# Patient Record
Sex: Male | Born: 1970 | Race: White | Hispanic: No | Marital: Married | Smoking: Former smoker
Health system: Southern US, Community
[De-identification: ages and names within clinical notes are randomized; demographics above are authoritative.]

## PROBLEM LIST (undated history)

## (undated) DIAGNOSIS — T6701XA Heatstroke and sunstroke, initial encounter: Secondary | ICD-10-CM

## (undated) DIAGNOSIS — K219 Gastro-esophageal reflux disease without esophagitis: Secondary | ICD-10-CM

## (undated) DIAGNOSIS — M1990 Unspecified osteoarthritis, unspecified site: Secondary | ICD-10-CM

## (undated) DIAGNOSIS — K648 Other hemorrhoids: Secondary | ICD-10-CM

## (undated) DIAGNOSIS — T7840XA Allergy, unspecified, initial encounter: Secondary | ICD-10-CM

## (undated) DIAGNOSIS — R9431 Abnormal electrocardiogram [ECG] [EKG]: Secondary | ICD-10-CM

## (undated) DIAGNOSIS — G4730 Sleep apnea, unspecified: Secondary | ICD-10-CM

## (undated) DIAGNOSIS — I10 Essential (primary) hypertension: Secondary | ICD-10-CM

## (undated) HISTORY — DX: Allergy, unspecified, initial encounter: T78.40XA

## (undated) HISTORY — PX: FRACTURE SURGERY: SHX138

## (undated) HISTORY — DX: Other hemorrhoids: K64.8

## (undated) HISTORY — PX: KNEE SURGERY: SHX244

## (undated) HISTORY — PX: HEMORRHOID BANDING: SHX5850

## (undated) HISTORY — PX: NECK SURGERY: SHX720

---

## 1998-02-09 ENCOUNTER — Ambulatory Visit (HOSPITAL_COMMUNITY): Admission: RE | Admit: 1998-02-09 | Discharge: 1998-02-09 | Payer: Self-pay | Admitting: *Deleted

## 1999-02-07 ENCOUNTER — Ambulatory Visit (HOSPITAL_COMMUNITY): Admission: RE | Admit: 1999-02-07 | Discharge: 1999-02-07 | Payer: Self-pay | Admitting: *Deleted

## 1999-03-18 ENCOUNTER — Ambulatory Visit (HOSPITAL_COMMUNITY): Admission: RE | Admit: 1999-03-18 | Discharge: 1999-03-18 | Payer: Self-pay | Admitting: *Deleted

## 1999-03-18 ENCOUNTER — Encounter: Payer: Self-pay | Admitting: *Deleted

## 1999-03-26 ENCOUNTER — Observation Stay (HOSPITAL_COMMUNITY): Admission: RE | Admit: 1999-03-26 | Discharge: 1999-03-27 | Payer: Self-pay | Admitting: Neurosurgery

## 1999-07-10 ENCOUNTER — Encounter: Admission: RE | Admit: 1999-07-10 | Discharge: 1999-07-10 | Payer: Self-pay | Admitting: Neurosurgery

## 1999-09-11 ENCOUNTER — Encounter: Admission: RE | Admit: 1999-09-11 | Discharge: 1999-09-11 | Payer: Self-pay | Admitting: Neurosurgery

## 2002-05-13 ENCOUNTER — Emergency Department (HOSPITAL_COMMUNITY): Admission: EM | Admit: 2002-05-13 | Discharge: 2002-05-13 | Payer: Self-pay | Admitting: Emergency Medicine

## 2002-05-13 ENCOUNTER — Encounter: Payer: Self-pay | Admitting: Emergency Medicine

## 2006-04-04 ENCOUNTER — Encounter: Admission: RE | Admit: 2006-04-04 | Discharge: 2006-04-04 | Payer: Self-pay | Admitting: Family Medicine

## 2011-08-17 ENCOUNTER — Other Ambulatory Visit: Payer: Self-pay | Admitting: Neurosurgery

## 2011-08-17 DIAGNOSIS — M25511 Pain in right shoulder: Secondary | ICD-10-CM

## 2011-08-17 DIAGNOSIS — M542 Cervicalgia: Secondary | ICD-10-CM

## 2011-08-23 ENCOUNTER — Ambulatory Visit
Admission: RE | Admit: 2011-08-23 | Discharge: 2011-08-23 | Disposition: A | Discharge status: Home / Self Care | Payer: PRIVATE HEALTH INSURANCE | Source: Ambulatory Visit | Attending: Neurosurgery | Admitting: Neurosurgery

## 2011-08-23 DIAGNOSIS — M542 Cervicalgia: Secondary | ICD-10-CM

## 2011-08-23 DIAGNOSIS — M25511 Pain in right shoulder: Secondary | ICD-10-CM

## 2011-09-15 NOTE — Progress Notes (Signed)
Called and requested orders from Dr. Trudee Grip office, spoke with Lupita Leash

## 2011-09-16 ENCOUNTER — Encounter (HOSPITAL_COMMUNITY)
Admission: RE | Admit: 2011-09-16 | Discharge: 2011-09-16 | Disposition: A | Discharge status: Home / Self Care | Payer: PRIVATE HEALTH INSURANCE | Source: Ambulatory Visit | Attending: Neurosurgery | Admitting: Neurosurgery

## 2011-09-16 ENCOUNTER — Encounter (HOSPITAL_COMMUNITY): Payer: Self-pay | Admitting: Pharmacy Technician

## 2011-09-16 ENCOUNTER — Encounter (HOSPITAL_COMMUNITY): Payer: Self-pay

## 2011-09-16 HISTORY — DX: Abnormal electrocardiogram (ECG) (EKG): R94.31

## 2011-09-16 HISTORY — DX: Gastro-esophageal reflux disease without esophagitis: K21.9

## 2011-09-16 HISTORY — DX: Unspecified osteoarthritis, unspecified site: M19.90

## 2011-09-16 LAB — CBC
Hemoglobin: 15.5 g/dL (ref 13.0–17.0)
MCV: 90.2 fL (ref 78.0–100.0)
Platelets: 216 10*3/uL (ref 150–400)
RBC: 4.9 MIL/uL (ref 4.22–5.81)
WBC: 6.9 10*3/uL (ref 4.0–10.5)

## 2011-09-16 LAB — SURGICAL PCR SCREEN: MRSA, PCR: NEGATIVE

## 2011-09-16 MED ORDER — VANCOMYCIN HCL 500 MG IV SOLR
500.0000 mg | Freq: Once | INTRAVENOUS | Status: AC
  Administered 2011-09-17: 500 mg via INTRAVENOUS
  Filled 2011-09-16: qty 500

## 2011-09-16 MED ORDER — TOBRAMYCIN SULFATE 80 MG/2ML IJ SOLN
80.0000 mg | Freq: Once | INTRAVENOUS | Status: AC
  Administered 2011-09-17: 80 mg via INTRAVENOUS
  Filled 2011-09-16: qty 2

## 2011-09-16 NOTE — Pre-Procedure Instructions (Signed)
20 MANUS WEEDMAN  09/16/2011   Your procedure is scheduled on:  Friday September 17 2011  Report to [facility] at 0530 AM.  Call this number if you have problems the morning of surgery: [phone]   Remember:   Do not eat food:After Midnight.  May have clear liquids: up to 4 Hours before arrival.(up to 1:30am)  Clear liquids include soda, tea, black coffee, apple or grape juice, broth.  Take these medicines the morning of surgery with A SIP OF WATER: hydrocodone, tramadol  Do not wear jewelry, make-up or nail polish.  Do not wear lotions, powders, or perfumes. You may wear deodorant.  Do not shave 48 hours prior to surgery.  Do not bring valuables to the hospital.  Contacts, dentures or bridgework may not be worn into surgery.  Leave suitcase in the car. After surgery it may be brought to your room.  For patients admitted to the hospital, checkout time is 11:00 AM the day of discharge.   Patients discharged the day of surgery will not be allowed to drive home.  Name and phone number of your driver: Skyy Mcknight [phone]  Special Instructions: CHG Shower Use Special Wash: 1/2 bottle night before surgery and 1/2 bottle morning of surgery.   Please read over the following fact sheets that you were given: Pain Booklet, Coughing and Deep Breathing, MRSA Information and Surgical Site Infection Prevention

## 2011-09-17 ENCOUNTER — Ambulatory Visit (HOSPITAL_COMMUNITY): Payer: PRIVATE HEALTH INSURANCE

## 2011-09-17 ENCOUNTER — Encounter (HOSPITAL_COMMUNITY): Payer: Self-pay | Admitting: Anesthesiology

## 2011-09-17 ENCOUNTER — Ambulatory Visit (HOSPITAL_COMMUNITY)
Admission: RE | Admit: 2011-09-17 | Discharge: 2011-09-17 | Disposition: A | Discharge status: Home / Self Care | Payer: PRIVATE HEALTH INSURANCE | Source: Ambulatory Visit | Attending: Neurosurgery | Admitting: Neurosurgery

## 2011-09-17 ENCOUNTER — Encounter (HOSPITAL_COMMUNITY)
Admission: RE | Disposition: A | Discharge status: Home / Self Care | Payer: Self-pay | Source: Ambulatory Visit | Attending: Neurosurgery

## 2011-09-17 ENCOUNTER — Encounter (HOSPITAL_COMMUNITY): Payer: Self-pay | Admitting: *Deleted

## 2011-09-17 ENCOUNTER — Ambulatory Visit (HOSPITAL_COMMUNITY): Payer: PRIVATE HEALTH INSURANCE | Admitting: Anesthesiology

## 2011-09-17 DIAGNOSIS — M5020 Other cervical disc displacement, unspecified cervical region: Secondary | ICD-10-CM | POA: Insufficient documentation

## 2011-09-17 DIAGNOSIS — K219 Gastro-esophageal reflux disease without esophagitis: Secondary | ICD-10-CM | POA: Insufficient documentation

## 2011-09-17 DIAGNOSIS — Z01812 Encounter for preprocedural laboratory examination: Secondary | ICD-10-CM | POA: Insufficient documentation

## 2011-09-17 HISTORY — PX: ANTERIOR CERVICAL DECOMP/DISCECTOMY FUSION: SHX1161

## 2011-09-17 SURGERY — ANTERIOR CERVICAL DECOMPRESSION/DISCECTOMY FUSION 1 LEVEL
Anesthesia: General | Site: Spine Cervical | Wound class: Clean

## 2011-09-17 MED ORDER — THROMBIN 5000 UNITS EX KIT
PACK | CUTANEOUS | Status: DC | PRN
  Administered 2011-09-17: 5000 [IU] via TOPICAL

## 2011-09-17 MED ORDER — HYDROMORPHONE HCL PF 1 MG/ML IJ SOLN
0.2500 mg | INTRAMUSCULAR | Status: DC | PRN
  Administered 2011-09-17 (×3): 0.5 mg via INTRAVENOUS

## 2011-09-17 MED ORDER — NEOSTIGMINE METHYLSULFATE 1 MG/ML IJ SOLN
INTRAMUSCULAR | Status: DC | PRN
  Administered 2011-09-17: 3 mg via INTRAVENOUS

## 2011-09-17 MED ORDER — SODIUM CHLORIDE 0.9 % IV SOLN
INTRAVENOUS | Status: AC
  Filled 2011-09-17: qty 500

## 2011-09-17 MED ORDER — ONDANSETRON HCL 4 MG/2ML IJ SOLN: 4.0000 mg | INTRAMUSCULAR | Status: DC | PRN

## 2011-09-17 MED ORDER — DIPHENHYDRAMINE HCL 50 MG/ML IJ SOLN
INTRAMUSCULAR | Status: AC
  Filled 2011-09-17: qty 1

## 2011-09-17 MED ORDER — HYDROCODONE-ACETAMINOPHEN 5-325 MG PO TABS
1.0000 | ORAL_TABLET | ORAL | Status: DC | PRN
  Administered 2011-09-17 (×2): 2 via ORAL
  Filled 2011-09-17: qty 2

## 2011-09-17 MED ORDER — HYDROCODONE-ACETAMINOPHEN 5-325 MG PO TABS
ORAL_TABLET | ORAL | Status: AC
  Filled 2011-09-17: qty 2

## 2011-09-17 MED ORDER — DEXAMETHASONE SODIUM PHOSPHATE 10 MG/ML IJ SOLN
INTRAMUSCULAR | Status: AC
  Filled 2011-09-17: qty 1

## 2011-09-17 MED ORDER — FENTANYL CITRATE 0.05 MG/ML IJ SOLN
INTRAMUSCULAR | Status: DC | PRN
  Administered 2011-09-17 (×2): 50 ug via INTRAVENOUS
  Administered 2011-09-17: 100 ug via INTRAVENOUS
  Administered 2011-09-17 (×2): 50 ug via INTRAVENOUS

## 2011-09-17 MED ORDER — ROCURONIUM BROMIDE 100 MG/10ML IV SOLN
INTRAVENOUS | Status: DC | PRN
  Administered 2011-09-17: 5 mg via INTRAVENOUS
  Administered 2011-09-17: 10 mg via INTRAVENOUS
  Administered 2011-09-17: 50 mg via INTRAVENOUS
  Administered 2011-09-17: 10 mg via INTRAVENOUS

## 2011-09-17 MED ORDER — DIAZEPAM 5 MG PO TABS: 5.0000 mg | ORAL_TABLET | Freq: Four times a day (QID) | ORAL | Status: AC | PRN

## 2011-09-17 MED ORDER — DEXAMETHASONE SODIUM PHOSPHATE 10 MG/ML IJ SOLN
10.0000 mg | Freq: Once | INTRAMUSCULAR | Status: AC
  Administered 2011-09-17: 10 mg via INTRAVENOUS

## 2011-09-17 MED ORDER — PROPOFOL 10 MG/ML IV EMUL
INTRAVENOUS | Status: DC | PRN
  Administered 2011-09-17: 200 mg via INTRAVENOUS

## 2011-09-17 MED ORDER — HYDROMORPHONE HCL PF 1 MG/ML IJ SOLN
INTRAMUSCULAR | Status: AC
  Filled 2011-09-17: qty 1

## 2011-09-17 MED ORDER — PHENOL 1.4 % MT LIQD: 1.0000 | OROMUCOSAL | Status: DC | PRN

## 2011-09-17 MED ORDER — LACTATED RINGERS IV SOLN
INTRAVENOUS | Status: DC | PRN
  Administered 2011-09-17 (×2): via INTRAVENOUS

## 2011-09-17 MED ORDER — HEMOSTATIC AGENTS (NO CHARGE) OPTIME
TOPICAL | Status: DC | PRN
  Administered 2011-09-17: 1 via TOPICAL

## 2011-09-17 MED ORDER — HYDROMORPHONE HCL PF 1 MG/ML IJ SOLN
INTRAMUSCULAR | Status: AC
  Administered 2011-09-17: 0.5 mg
  Filled 2011-09-17: qty 1

## 2011-09-17 MED ORDER — BACITRACIN 50000 UNITS IM SOLR
INTRAMUSCULAR | Status: AC
  Filled 2011-09-17: qty 50000

## 2011-09-17 MED ORDER — GLYCOPYRROLATE 0.2 MG/ML IJ SOLN
INTRAMUSCULAR | Status: DC | PRN
  Administered 2011-09-17: .4 mg via INTRAVENOUS

## 2011-09-17 MED ORDER — SODIUM CHLORIDE 0.9 % IJ SOLN: 3.0000 mL | Freq: Two times a day (BID) | INTRAMUSCULAR | Status: DC

## 2011-09-17 MED ORDER — HYDROMORPHONE HCL PF 1 MG/ML IJ SOLN
1.0000 mg | INTRAMUSCULAR | Status: DC | PRN
  Administered 2011-09-17 (×2): 1.5 mg via INTRAMUSCULAR
  Filled 2011-09-17 (×2): qty 2

## 2011-09-17 MED ORDER — DEXAMETHASONE SODIUM PHOSPHATE 4 MG/ML IJ SOLN
4.0000 mg | Freq: Four times a day (QID) | INTRAMUSCULAR | Status: AC
  Administered 2011-09-17: 4 mg via INTRAVENOUS
  Filled 2011-09-17: qty 1

## 2011-09-17 MED ORDER — KCL IN DEXTROSE-NACL 20-5-0.45 MEQ/L-%-% IV SOLN
80.0000 mL/h | INTRAVENOUS | Status: DC
  Administered 2011-09-17: 80 mL/h via INTRAVENOUS
  Filled 2011-09-17 (×2): qty 1000

## 2011-09-17 MED ORDER — DIPHENHYDRAMINE HCL 50 MG/ML IJ SOLN
50.0000 mg | Freq: Once | INTRAMUSCULAR | Status: AC
  Administered 2011-09-17: 50 mg via INTRAVENOUS

## 2011-09-17 MED ORDER — ACETAMINOPHEN 650 MG RE SUPP: 650.0000 mg | RECTAL | Status: DC | PRN

## 2011-09-17 MED ORDER — ACETAMINOPHEN 325 MG PO TABS: 650.0000 mg | ORAL_TABLET | ORAL | Status: DC | PRN

## 2011-09-17 MED ORDER — MIDAZOLAM HCL 5 MG/5ML IJ SOLN
INTRAMUSCULAR | Status: DC | PRN
  Administered 2011-09-17: 2 mg via INTRAVENOUS

## 2011-09-17 MED ORDER — ONDANSETRON HCL 4 MG/2ML IJ SOLN
INTRAMUSCULAR | Status: DC | PRN
  Administered 2011-09-17: 4 mg via INTRAVENOUS

## 2011-09-17 MED ORDER — SODIUM CHLORIDE 0.9 % IJ SOLN: 3.0000 mL | INTRAMUSCULAR | Status: DC | PRN

## 2011-09-17 MED ORDER — DEXAMETHASONE 4 MG PO TABS
4.0000 mg | ORAL_TABLET | Freq: Four times a day (QID) | ORAL | Status: AC
  Administered 2011-09-17: 4 mg via ORAL
  Filled 2011-09-17: qty 1

## 2011-09-17 MED ORDER — PROMETHAZINE HCL 25 MG/ML IJ SOLN: 6.2500 mg | INTRAMUSCULAR | Status: DC | PRN

## 2011-09-17 MED ORDER — OXYCODONE-ACETAMINOPHEN 10-325 MG PO TABS: 1.0000 | ORAL_TABLET | ORAL | Status: AC | PRN

## 2011-09-17 MED ORDER — 0.9 % SODIUM CHLORIDE (POUR BTL) OPTIME
TOPICAL | Status: DC | PRN
  Administered 2011-09-17: 1000 mL

## 2011-09-17 MED ORDER — LACTATED RINGERS IV SOLN: INTRAVENOUS | Status: DC

## 2011-09-17 MED ORDER — MEPERIDINE HCL 25 MG/ML IJ SOLN: 6.2500 mg | INTRAMUSCULAR | Status: DC | PRN

## 2011-09-17 MED ORDER — MENTHOL 3 MG MT LOZG: 1.0000 | LOZENGE | OROMUCOSAL | Status: DC | PRN

## 2011-09-17 MED ORDER — SODIUM CHLORIDE 0.9 % IR SOLN
Status: DC | PRN
  Administered 2011-09-17: 08:00:00

## 2011-09-17 MED ORDER — ZOLPIDEM TARTRATE 5 MG PO TABS: 10.0000 mg | ORAL_TABLET | Freq: Every evening | ORAL | Status: DC | PRN

## 2011-09-17 SURGICAL SUPPLY — 56 items
APL SKNCLS STERI-STRIP NONHPOA (GAUZE/BANDAGES/DRESSINGS) ×1
BAG DECANTER FOR FLEXI CONT (MISCELLANEOUS) ×2 IMPLANT
BENZOIN TINCTURE PRP APPL 2/3 (GAUZE/BANDAGES/DRESSINGS) ×4 IMPLANT
BRUSH SCRUB EZ PLAIN DRY (MISCELLANEOUS) ×2 IMPLANT
CAGE PEEK TAPERED STALIF 8.5MM (Cage) ×1 IMPLANT
CANISTER SUCTION 2500CC (MISCELLANEOUS) ×2 IMPLANT
CLOTH BEACON ORANGE TIMEOUT ST (SAFETY) ×2 IMPLANT
CONT SPEC 4OZ CLIKSEAL STRL BL (MISCELLANEOUS) ×2 IMPLANT
DRAPE C-ARM 42X72 X-RAY (DRAPES) ×4 IMPLANT
DRAPE LAPAROTOMY 100X72 PEDS (DRAPES) ×2 IMPLANT
DRAPE MICROSCOPE ZEISS OPMI (DRAPES) ×2 IMPLANT
DRAPE POUCH INSTRU U-SHP 10X18 (DRAPES) ×2 IMPLANT
DRAPE SURG 17X23 STRL (DRAPES) ×4 IMPLANT
DRESSING TELFA 8X3 (GAUZE/BANDAGES/DRESSINGS) ×2 IMPLANT
ELECT COATED BLADE 2.86 ST (ELECTRODE) ×2 IMPLANT
ELECT REM PT RETURN 9FT ADLT (ELECTROSURGICAL) ×2
ELECTRODE REM PT RTRN 9FT ADLT (ELECTROSURGICAL) ×1 IMPLANT
GAUZE SPONGE 4X4 16PLY XRAY LF (GAUZE/BANDAGES/DRESSINGS) ×1 IMPLANT
GLOVE BIOGEL PI IND STRL 8.5 (GLOVE) IMPLANT
GLOVE BIOGEL PI INDICATOR 8.5 (GLOVE) ×1
GLOVE ECLIPSE 7.5 STRL STRAW (GLOVE) ×2 IMPLANT
GLOVE ECLIPSE 8.5 STRL (GLOVE) ×1 IMPLANT
GLOVE EXAM NITRILE LRG STRL (GLOVE) IMPLANT
GLOVE EXAM NITRILE MD LF STRL (GLOVE) ×1 IMPLANT
GLOVE EXAM NITRILE XL STR (GLOVE) IMPLANT
GLOVE EXAM NITRILE XS STR PU (GLOVE) IMPLANT
GLOVE SURG SS PI 8.0 STRL IVOR (GLOVE) ×2 IMPLANT
GOWN BRE IMP SLV AUR LG STRL (GOWN DISPOSABLE) ×1 IMPLANT
GOWN BRE IMP SLV AUR XL STRL (GOWN DISPOSABLE) ×2 IMPLANT
GOWN STRL REIN 2XL LVL4 (GOWN DISPOSABLE) ×2 IMPLANT
HEAD HALTER (SOFTGOODS) ×2 IMPLANT
KIT BASIN OR (CUSTOM PROCEDURE TRAY) ×2 IMPLANT
KIT ROOM TURNOVER OR (KITS) ×2 IMPLANT
NDL SPNL 20GX3.5 QUINCKE YW (NEEDLE) ×1 IMPLANT
NEEDLE SPNL 20GX3.5 QUINCKE YW (NEEDLE) ×2 IMPLANT
NS IRRIG 1000ML POUR BTL (IV SOLUTION) ×2 IMPLANT
PACK LAMINECTOMY NEURO (CUSTOM PROCEDURE TRAY) ×2 IMPLANT
PAD ARMBOARD 7.5X6 YLW CONV (MISCELLANEOUS) ×4 IMPLANT
PATTIES SURGICAL .25X.25 (GAUZE/BANDAGES/DRESSINGS) IMPLANT
PATTIES SURGICAL .75X.75 (GAUZE/BANDAGES/DRESSINGS) ×2 IMPLANT
PUTTY BONE GRAFT KIT 2.5ML (Bone Implant) ×1 IMPLANT
RUBBERBAND STERILE (MISCELLANEOUS) ×4 IMPLANT
SCREW PRIM STALIF LG ABO 15MM (Screw) ×1 IMPLANT
SCREW REV STALIF LG ABO 16MM (Screw) ×2 IMPLANT
SPONGE GAUZE 4X4 12PLY (GAUZE/BANDAGES/DRESSINGS) ×2 IMPLANT
SPONGE INTESTINAL PEANUT (DISPOSABLE) ×2 IMPLANT
SPONGE SURGIFOAM ABS GEL SZ50 (HEMOSTASIS) ×2 IMPLANT
STRIP CLOSURE SKIN 1/2X4 (GAUZE/BANDAGES/DRESSINGS) ×2 IMPLANT
SUT PDS AB 5-0 P3 18 (SUTURE) ×2 IMPLANT
SUT VIC AB 3-0 CP2 18 (SUTURE) ×2 IMPLANT
SYR 20ML ECCENTRIC (SYRINGE) ×1 IMPLANT
TOOL MATCHSTK 3MM (MISCELLANEOUS) ×2 IMPLANT
TOWEL OR 17X24 6PK STRL BLUE (TOWEL DISPOSABLE) ×2 IMPLANT
TOWEL OR 17X26 10 PK STRL BLUE (TOWEL DISPOSABLE) ×2 IMPLANT
TRAP SPECIMEN MUCOUS 40CC (MISCELLANEOUS) ×1 IMPLANT
WATER STERILE IRR 1000ML POUR (IV SOLUTION) ×2 IMPLANT

## 2011-09-17 NOTE — Preoperative (Signed)
Beta Blockers   Reason not to administer Beta Blockers:Not Applicable 

## 2011-09-17 NOTE — Transfer of Care (Signed)
Immediate Anesthesia Transfer of Care Note  Patient: Jeff Cowan  Procedure(s) Performed:  ANTERIOR CERVICAL DECOMPRESSION/DISCECTOMY FUSION 1 LEVEL - Anterior Cervical Decompression and Fusion w/Stalif cage Cervical five-six (Zimmerspine)  Patient Location: PACU  Anesthesia Type: General  Level of Consciousness: awake and alert   Airway & Oxygen Therapy: Patient Spontanous Breathing and Patient connected to face mask oxygen  Post-op Assessment: Report given to PACU RN  Post vital signs: Reviewed and stable  Complications: No apparent anesthesia complications

## 2011-09-17 NOTE — H&P (Signed)
Jeff Cowan is an 40 y.o. male.   Chief Complaint: Right arm pain HPI: The patient is a 40 year old gentleman with right arm pain. He was tried on conservative therapy without improvement. He has had an old anterior cervical discectomy at C6-7. A new MRI scan showed a disc herniation at C5-6 on the right. There is marked nerve root compression. After discussing the options he requested surgical intervention and is brought to the hospital at this time for C5-6 anterior cervical discectomy fusion. I had a long discussion with him regarding the risks and benefits of surgical intervention. The risks discussed include but are not limited to bleeding infection weakness numbness paralysis spinal fluid leak hoarseness and death. We have discussed alternative methods of therapy along with the risks and benefits of nonintervention. He said the options numerous questions and appears to understand. With this information in hand he has requested that we proceed with surgical intervention.  Past Medical History  Diagnosis Date  . GERD (gastroesophageal reflux disease)   . T wave inversion in EKG     "patient states that has been his norm since 2002, cardiologist seen at that tme  . Arthritis     degenerative disc    Past Surgical History  Procedure Date  . Neck surgery     2000  . Fracture surgery     left hand    History reviewed. No pertinent family history. Social History:  reports that he has quit smoking. He does not have any smokeless tobacco history on file. He reports that he does not drink alcohol or use illicit drugs.  Allergies:  Allergies  Allergen Reactions  . Penicillins Hives    Medications Prior to Admission  Medication Dose Route Frequency Provider Last Rate Last Dose  . bacitracin 16109 UNITS injection           . dexamethasone (DECADRON) 10 MG/ML injection           . dexamethasone (DECADRON) injection 10 mg  10 mg Intravenous Once Yahoo      . diphenhydrAMINE  (BENADRYL) 50 MG/ML injection           . diphenhydrAMINE (BENADRYL) injection 50 mg  50 mg Intravenous Once Yahoo      . sodium chloride 0.9 % infusion           . tobramycin (NEBCIN) 80 mg in dextrose 5 % 50 mL IVPB  80 mg Intravenous Once Yahoo      . vancomycin (VANCOCIN) 500 mg in sodium chloride 0.9 % 100 mL IVPB  500 mg Intravenous Once Rolanda Lundborg [facility]       Medications Prior to Admission  Medication Sig Dispense Refill  . cyclobenzaprine (FLEXERIL) 10 MG tablet Take 10 mg by mouth at bedtime as needed. For muscle spasms and sleep       . Garlic 1000 MG CAPS Take 1 capsule by mouth daily.        [name] HYDROcodone-acetaminophen (VICODIN) 5-500 MG per tablet Take 1 tablet by mouth every 4 (four) hours as needed. For pain       . Multiple Vitamin (MULITIVITAMIN WITH MINERALS) TABS Take 1 tablet by mouth daily.        . Omega-3 Fatty Acids (FISH OIL PO) Take 1 capsule by mouth daily. 1500mg        . OVER THE COUNTER MEDICATION Take 1 capsule by mouth daily. c-la 1000mg        . tetrahydrozoline  0.05 % ophthalmic solution Place 2 drops into both eyes daily as needed. For dry eyes       . traMADol (ULTRAM) 50 MG tablet Take 50 mg by mouth every 4 (four) hours as needed. For pain  Maximum dose= 8 tablets per day         Results for orders placed during the hospital encounter of 09/16/11 (from the past 48 hour(s))  CBC     Status: Normal   Collection Time   09/16/11  1:43 PM      Component Value Range Comment   WBC 6.9  4.0 - 10.5 (K/uL)    RBC 4.90  4.22 - 5.81 (MIL/uL)    Hemoglobin 15.5  13.0 - 17.0 (g/dL)    HCT 16.1  09.6 - 04.5 (%)    MCV 90.2  78.0 - 100.0 (fL)    MCH 31.6  26.0 - 34.0 (pg)    MCHC 35.1  30.0 - 36.0 (g/dL)    RDW 40.9  81.1 - 91.4 (%)    Platelets 216  150 - 400 (K/uL)   SURGICAL PCR SCREEN     Status: Normal   Collection Time   09/16/11  1:46 PM      Component Value Range Comment   MRSA, PCR NEGATIVE  NEGATIVE     Staphylococcus aureus  NEGATIVE  NEGATIVE     No results found.  Pertinent items are noted in HPI.  Blood pressure 133/83, pulse 61, temperature 98.1 F (36.7 C), temperature source Oral, resp. rate 18, height 5\' 11"  (1.803 m), weight 90.719 kg (200 lb), SpO2 96.00%.  He has numbness in the C6 distribution as well as biceps weakness on the right. Assessment/Plan Impression is that of a C6 radiculopathy secondary to a herniated disc at C5-6. Plan is for anterior cervical discectomy with fusion.  Jeff Meeker, MD 09/17/2011, 7:35 AM

## 2011-09-17 NOTE — Op Note (Signed)
Preop diagnosis: Herniated disc C5-6 right Postop diagnosis: Same Procedure: C5-6 anterior cervical discectomy with peek interbody spacer and stalif anterior cervical instrumentation Surgeon: Amantha Sklar Assistant: Pool  After and placed in the supine position and 10 pounds halter traction the patient's neck was prepped and draped in the usual sterile fashion. Localizing x-ray was taken prior to incision to identify the appropriate level. We are incision was made along the border between the strap muscles and sternal cleidomastoid laterally. Platysma muscle was incised and the natural fascial plane between the strap muscles medially and the sternocleidomastoid muscle laterally was identified and followed down to the anterior aspect of the cervical spine. Longus cole muscles identified split in the midline and stripped bilaterally with unipolar coagulation and Barista. Self retaining retractor was placed for exposure and x-ray showed approach the appropriate level. 15 blade was used to incise the disc at C5-6. Approximately 90% of the disc material was removed with curettes and pituitary rongeurs. High-speed drill was used to widen the interspace and bony shavings were saved for use later in the case. The microscope was then draped brought into the field and used for the remainder of the case. Using microdissection technique the remainder of the disc at of the posterior longitudinal ligament was removed. It was then incised transversely and the cut edges removed a Kerrison punch. Thorough decompression was carried out on the spinal dura into the foramen bilaterally. Large amounts of herniated disc material another large calcified spur identify towards the right, symptomatic side and this was removed. The uncovertebral process was removed as well on the right side of the visualized decompress the C6 nerve root. At this time inspection was carried out in all directions for any evidence of residual  compression and none could be identified. Irrigation was carried out any bleeding was controlled with bipolar coagulation and Gelfoam. Measurements were taken and an 8.5 mm stalif cage was chosen. It was filled with a mixture of autologous bone and morselized allograft and impacted without difficulty. Fluoroscopy showed to be in excellent position. The graft was then secured to the vertebral with 2 screws into C5 and one screw into C6. Fluoroscopy showed the screws to be in excellent position. Large amounts of irrigation were carried out any bleeding control proper coagulation. Then closed with inverted Vicryl on the platysma muscle and 5-0 PDS in the subcuticular layer. Steri-Strips were applied along with a sterile dressing a soft collar and the patient was extubated and taken to recovery room in stable condition.

## 2011-09-17 NOTE — Discharge Summary (Signed)
  Patient with HNP C56. Had acdf. Did well. Home 5 hours post op neuro intact and pain free.

## 2011-09-17 NOTE — Anesthesia Preprocedure Evaluation (Addendum)
Anesthesia Evaluation  Patient identified by MRN, date of birth, ID band Patient awake    Reviewed: Allergy & Precautions, H&P , NPO status , Patient's Chart, lab work & pertinent test results  Airway Mallampati: II TM Distance: >3 FB Neck ROM: Full    Dental  (+) Teeth Intact   Pulmonary  clear to auscultation        Cardiovascular Regular Normal    Neuro/Psych    GI/Hepatic GERD-  ,  Endo/Other    Renal/GU      Musculoskeletal  (+) Arthritis -, Osteoarthritis,    Abdominal   Peds  Hematology   Anesthesia Other Findings   Reproductive/Obstetrics                          Anesthesia Physical Anesthesia Plan  ASA: II  Anesthesia Plan: General   Post-op Pain Management:    Induction: Intravenous  Airway Management Planned: Oral ETT  Additional Equipment:   Intra-op Plan:   Post-operative Plan: Extubation in OR  Informed Consent: I have reviewed the patients History and Physical, chart, labs and discussed the procedure including the risks, benefits and alternatives for the proposed anesthesia with the patient or authorized representative who has indicated his/her understanding and acceptance.   Dental advisory given  Plan Discussed with: CRNA and Anesthesiologist  Anesthesia Plan Comments:         Anesthesia Quick Evaluation

## 2011-09-17 NOTE — Anesthesia Postprocedure Evaluation (Signed)
  Anesthesia Post-op Note  Patient: Jeff Cowan  Procedure(s) Performed:  ANTERIOR CERVICAL DECOMPRESSION/DISCECTOMY FUSION 1 LEVEL - Anterior Cervical Decompression and Fusion w/Stalif cage Cervical five-six (Zimmerspine)  Patient Location: PACU  Anesthesia Type: General  Level of Consciousness: awake  Airway and Oxygen Therapy: Patient Spontanous Breathing  Post-op Pain: mild  Post-op Assessment: Post-op Vital signs reviewed  Post-op Vital Signs: stable  Complications: No apparent anesthesia complications

## 2011-09-17 NOTE — Brief Op Note (Signed)
09/17/2011  9:40 AM  PATIENT:  Jeff Cowan  40 y.o. male  PRE-OPERATIVE DIAGNOSIS:  HNP  POST-OPERATIVE DIAGNOSIS:  Herniated Nucleous Pulposus,Cervical five-six  PROCEDURE:  Procedure(s): ANTERIOR CERVICAL DECOMPRESSION/DISCECTOMY FUSION 1 LEVEL  SURGEON:  Surgeon(s): Lesslie Mossa O Weslee Fogg [employer]  PHYSICIAN ASSISTANT:   ASSISTANTS: Pool   ANESTHESIA:   general  EBL:  Total I/O In: 1300 [I.V.:1300] Out: 100 [Blood:100]  BLOOD ADMINISTERED:none  DRAINS: none   LOCAL MEDICATIONS USED:  NONE  SPECIMEN:  No Specimen  DISPOSITION OF SPECIMEN:  N/A  COUNTS:  YES  TOURNIQUET:  * No tourniquets in log *  DICTATION: .Dragon Dictation  PLAN OF CARE: Admit for overnight observation  PATIENT DISPOSITION:  PACU - hemodynamically stable.   Delay start of Pharmacological VTE agent (>24hrs) due to surgical blood loss or risk of bleeding:  {YES/NO/NOT APPLICABLE:20182

## 2011-09-23 ENCOUNTER — Encounter (HOSPITAL_COMMUNITY): Payer: Self-pay | Admitting: Neurosurgery

## 2011-10-04 ENCOUNTER — Ambulatory Visit
Admission: RE | Admit: 2011-10-04 | Discharge: 2011-10-04 | Disposition: A | Discharge status: Home / Self Care | Payer: PRIVATE HEALTH INSURANCE | Source: Ambulatory Visit | Attending: Neurosurgery | Admitting: Neurosurgery

## 2011-10-04 ENCOUNTER — Other Ambulatory Visit: Payer: Self-pay | Admitting: Neurosurgery

## 2011-10-04 DIAGNOSIS — M542 Cervicalgia: Secondary | ICD-10-CM

## 2015-09-03 ENCOUNTER — Other Ambulatory Visit: Payer: Self-pay | Admitting: Neurosurgery

## 2015-09-03 DIAGNOSIS — M5416 Radiculopathy, lumbar region: Secondary | ICD-10-CM

## 2015-09-15 ENCOUNTER — Ambulatory Visit
Admission: RE | Admit: 2015-09-15 | Discharge: 2015-09-15 | Disposition: A | Discharge status: Home / Self Care | Payer: BLUE CROSS/BLUE SHIELD | Source: Ambulatory Visit | Attending: Neurosurgery | Admitting: Neurosurgery

## 2015-09-15 DIAGNOSIS — M5416 Radiculopathy, lumbar region: Secondary | ICD-10-CM

## 2015-09-15 MED ORDER — IOHEXOL 180 MG/ML  SOLN
15.0000 mL | Freq: Once | INTRAMUSCULAR | Status: AC | PRN
  Administered 2015-09-15: 15 mL via INTRATHECAL

## 2015-09-15 MED ORDER — DIAZEPAM 5 MG PO TABS
10.0000 mg | ORAL_TABLET | Freq: Once | ORAL | Status: AC
  Administered 2015-09-15: 10 mg via ORAL

## 2015-09-15 MED ORDER — IOHEXOL 180 MG/ML  SOLN: 1.0000 mL | Freq: Once | INTRAMUSCULAR | Status: DC | PRN

## 2015-09-15 MED ORDER — METHYLPREDNISOLONE ACETATE 40 MG/ML INJ SUSP (RADIOLOG: 120.0000 mg | Freq: Once | INTRAMUSCULAR | Status: DC

## 2015-09-15 NOTE — Discharge Instructions (Signed)
Myelogram Discharge Instructions  1. Go home and rest quietly for the next 24 hours.  It is important to lie flat for the next 24 hours.  Get up only to go to the restroom.  You may lie in the bed or on a couch on your back, your stomach, your left side or your right side.  You may have one pillow under your head.  You may have pillows between your knees while you are on your side or under your knees while you are on your back.  2. DO NOT drive today.  Recline the seat as far back as it will go, while still wearing your seat belt, on the way home.  3. You may get up to go to the bathroom as needed.  You may sit up for 10 minutes to eat.  You may resume your normal diet and medications unless otherwise indicated.  Drink lots of extra fluids today and tomorrow.  4. The incidence of headache, nausea, or vomiting is about 5% (one in 20 patients).  If you develop a headache, lie flat and drink plenty of fluids until the headache goes away.  Caffeinated beverages may be helpful.  If you develop severe nausea and vomiting or a headache that does not go away with flat bed rest, call [phone].  5. You may resume normal activities after your 24 hours of bed rest is over; however, do not exert yourself strongly or do any heavy lifting tomorrow. If when you get up you have a headache when standing, go back to bed and force fluids for another 24 hours.  6. Call your physician for a follow-up appointment.  The results of your myelogram will be sent directly to your physician by the following day.  7. If you have any questions or if complications develop after you arrive home, please call [phone].  Discharge instructions have been explained to the patient.  The patient, or the person responsible for the patient, fully understands these instructions.       May resume Tramadol on Dec. 20, 2016, after 8:00 am.

## 2015-09-15 NOTE — Progress Notes (Signed)
Patient states he has been off Tramadol for at least the past two days. 

## 2015-09-26 ENCOUNTER — Encounter (HOSPITAL_COMMUNITY): Payer: Self-pay | Admitting: *Deleted

## 2015-09-30 ENCOUNTER — Inpatient Hospital Stay: Admit: 2015-09-30 | Payer: Self-pay | Admitting: Neurosurgery

## 2015-09-30 SURGERY — POSTERIOR LUMBAR FUSION 1 LEVEL
Anesthesia: General | Site: Back | Laterality: Left

## 2015-10-01 ENCOUNTER — Other Ambulatory Visit: Payer: Self-pay | Admitting: Neurosurgery

## 2015-10-10 ENCOUNTER — Other Ambulatory Visit (HOSPITAL_COMMUNITY): Payer: BLUE CROSS/BLUE SHIELD

## 2015-10-10 ENCOUNTER — Other Ambulatory Visit (HOSPITAL_COMMUNITY): Payer: Self-pay | Admitting: Neurosurgery

## 2015-10-25 NOTE — Pre-Procedure Instructions (Signed)
Jeff Cowan  10/25/2015     Your procedure is scheduled on February 8.  Report to [facility] Admitting at 6:30 A.M.  Call this number if you have problems the morning of surgery:  [phone]   Remember:  Do not eat food or drink liquids after midnight.  Take these medicines the morning of surgery with A SIP OF WATER Hydrocodone OR Tramadol (if needed) Eye drops (if needed), Proair (if needed), Colchicine, Finasteride   STOP Biotin, Multiple vitamins, Fish Oil, C- Ia February 1   STOP/ Do not take Aspirin, Aleve, Naproxen, Advil, Ibuprofen, Motrin, Vitamins, Herbs, or Supplements starting February 1   Do not wear jewelry, make-up or nail polish.  Do not wear lotions, powders, or perfumes.  You may wear deodorant.  Do not shave 48 hours prior to surgery.  Men may shave face and neck.  Do not bring valuables to the hospital.  [facility] is not responsible for any belongings or valuables.  Contacts, dentures or bridgework may not be worn into surgery.  Leave your suitcase in the car.  After surgery it may be brought to your room.  For patients admitted to the hospital, discharge time will be determined by your treatment team.  Patients discharged the day of surgery will not be allowed to drive home.   [place] - Preparing for Surgery  Before surgery, you can play an important role.  Because skin is not sterile, your skin needs to be as free of germs as possible.  You can reduce the number of germs on you skin by washing with CHG (chlorahexidine gluconate) soap before surgery.  CHG is an antiseptic cleaner which kills germs and bonds with the skin to continue killing germs even after washing.  Please DO NOT use if you have an allergy to CHG or antibacterial soaps.  If your skin becomes reddened/irritated stop using the CHG and inform your nurse when you arrive at Short Stay.  Do not shave (including legs and underarms) for at least 48 hours prior to the first CHG  shower.  You may shave your face.  Please follow these instructions carefully:   1.  Shower with CHG Soap the night before surgery and the morning of Surgery.  2.  If you choose to wash your hair, wash your hair first as usual with your normal shampoo.  3.  After you shampoo, rinse your hair and body thoroughly to remove the shampoo.  4.  Use CHG as you would any other liquid soap.  You can apply CHG directly to the skin and wash gently with scrungie or a clean washcloth.  5.  Apply the CHG Soap to your body ONLY FROM THE NECK DOWN.  Do not use on open wounds or open sores.  Avoid contact with your eyes, ears, mouth and genitals (private parts).  Wash genitals (private parts) with your normal soap.  6.  Wash thoroughly, paying special attention to the area where your surgery will be performed.  7.  Thoroughly rinse your body with warm water from the neck down.  8.  DO NOT shower/wash with your normal soap after using and rinsing off the CHG Soap.  9.  Pat yourself dry with a clean towel.            10.  Wear clean pajamas.            11.  Place clean sheets on your bed the night of your first  shower and do not sleep with pets.  Day of Surgery  Do not apply any lotions the morning of surgery.  Please wear clean clothes to the hospital/surgery center.  Please read over the following fact sheets that you were given. Pain Booklet, Coughing and Deep Breathing, Blood Transfusion Information and Surgical Site Infection Prevention

## 2015-10-27 ENCOUNTER — Encounter (HOSPITAL_COMMUNITY): Payer: Self-pay

## 2015-10-27 DIAGNOSIS — R9431 Abnormal electrocardiogram [ECG] [EKG]: Secondary | ICD-10-CM | POA: Diagnosis not present

## 2015-10-27 DIAGNOSIS — M5416 Radiculopathy, lumbar region: Secondary | ICD-10-CM | POA: Diagnosis not present

## 2015-10-27 DIAGNOSIS — Z0183 Encounter for blood typing: Secondary | ICD-10-CM | POA: Insufficient documentation

## 2015-10-27 DIAGNOSIS — Z87891 Personal history of nicotine dependence: Secondary | ICD-10-CM | POA: Insufficient documentation

## 2015-10-27 DIAGNOSIS — Z981 Arthrodesis status: Secondary | ICD-10-CM | POA: Diagnosis not present

## 2015-10-27 DIAGNOSIS — Z01818 Encounter for other preprocedural examination: Secondary | ICD-10-CM | POA: Diagnosis present

## 2015-10-27 DIAGNOSIS — G4733 Obstructive sleep apnea (adult) (pediatric): Secondary | ICD-10-CM | POA: Insufficient documentation

## 2015-10-27 DIAGNOSIS — Z01812 Encounter for preprocedural laboratory examination: Secondary | ICD-10-CM | POA: Insufficient documentation

## 2015-10-27 DIAGNOSIS — I080 Rheumatic disorders of both mitral and aortic valves: Secondary | ICD-10-CM | POA: Insufficient documentation

## 2015-10-27 DIAGNOSIS — R079 Chest pain, unspecified: Secondary | ICD-10-CM | POA: Insufficient documentation

## 2015-10-27 DIAGNOSIS — Z01810 Encounter for preprocedural cardiovascular examination: Secondary | ICD-10-CM | POA: Diagnosis not present

## 2015-10-27 DIAGNOSIS — I10 Essential (primary) hypertension: Secondary | ICD-10-CM | POA: Insufficient documentation

## 2015-10-27 DIAGNOSIS — K219 Gastro-esophageal reflux disease without esophagitis: Secondary | ICD-10-CM | POA: Diagnosis not present

## 2015-10-27 LAB — CBC
HCT: 43.3 % (ref 39.0–52.0)
HEMOGLOBIN: 14.8 g/dL (ref 13.0–17.0)
MCH: 30.7 pg (ref 26.0–34.0)
MCHC: 34.2 g/dL (ref 30.0–36.0)
MCV: 89.8 fL (ref 78.0–100.0)
Platelets: 184 10*3/uL (ref 150–400)
RBC: 4.82 MIL/uL (ref 4.22–5.81)
RDW: 13.6 % (ref 11.5–15.5)
WBC: 6.3 10*3/uL (ref 4.0–10.5)

## 2015-10-27 LAB — BASIC METABOLIC PANEL
ANION GAP: 9 (ref 5–15)
BUN: 17 mg/dL (ref 6–20)
CALCIUM: 9.4 mg/dL (ref 8.9–10.3)
CHLORIDE: 106 mmol/L (ref 101–111)
CO2: 27 mmol/L (ref 22–32)
Creatinine, Ser: 0.98 mg/dL (ref 0.61–1.24)
GFR calc non Af Amer: >60 mL/min (ref >60)
Glucose, Bld: 95 mg/dL (ref 65–99)
Potassium: 4.8 mmol/L (ref 3.5–5.1)
SODIUM: 142 mmol/L (ref 135–145)

## 2015-10-27 LAB — TYPE AND SCREEN
ABO/RH(D): O POS
Antibody Screen: NEGATIVE

## 2015-10-27 LAB — ABO/RH: ABO/RH(D): O POS

## 2015-10-27 LAB — SURGICAL PCR SCREEN
MRSA, PCR: NEGATIVE
Staphylococcus aureus: NEGATIVE

## 2015-10-27 NOTE — Progress Notes (Signed)
PCP is Dr Prince Solian States he saw a cardiologist many years ago when he had a heat stroke. Denies ever having a card cath, stress test or echo. States he had a sleep study and has a cpap, but doesn't wear it, and can not  Remember where he had the study done.

## 2015-10-28 ENCOUNTER — Telehealth: Payer: Self-pay | Admitting: Internal Medicine

## 2015-10-28 NOTE — Telephone Encounter (Signed)
°  New Prob   Pt has some questions regarding his EKG that was performed during pre-op appointment. Please call.

## 2015-10-28 NOTE — Telephone Encounter (Signed)
Received records and Surgical Clearance request from [facility] for appointment on 10/30/15 with Dr Debara Pickett.  Records given to [facility] (medical records) for Dr Lysbeth Penner schedule on 10/30/15.

## 2015-10-28 NOTE — Telephone Encounter (Signed)
PT  AWARE  EKG ON  FILE  IS  BRADYCARDIC AS  WELL AS  T WAVE  ABNORMALITY   WITH   FIRST  DEGREE AV  BLOCK  ENCOURAGED  PT  TO KEEP APPT  FOR   PRE OP CLEARANCE NO OTHER  EKG'S IN  SYSTEM TO  COMPARE  READINGS  PT  VERBALIZED  UNDERSTANDING .Adonis Housekeeper

## 2015-10-29 ENCOUNTER — Encounter (HOSPITAL_COMMUNITY): Payer: Self-pay | Admitting: Vascular Surgery

## 2015-10-29 ENCOUNTER — Telehealth: Payer: Self-pay | Admitting: Internal Medicine

## 2015-10-29 NOTE — Telephone Encounter (Signed)
Received records from [facility] for appointment on 10/30/15 with Dr  Debara Pickett.  Records given to [facility] (medical records) for Dr Lysbeth Penner schedule on 10/30/15. lp

## 2015-10-29 NOTE — Progress Notes (Addendum)
Anesthesia Chart Review: Patient is a 45 year old male scheduled for L5-S1 PLIF with Pathfinder screws with L4-5 decompression with Coflex on 11/05/15 by Dr. Hal Neer.  History includes former smoker, GERD, HTN, OSA without CPAP use, heat stroke, DDD, C5-6 ACDF '12. PCP is Dr. Prince Solian.  10/27/15 EKG: SB at 49 bpm, first degree AVB, T wave abnormality, consider inferolateral ischemia. Inferolateral TWI is new since 03/26/99 tracing. He reports he saw a cardiologist in 2002 for an abnormal EKG, but I do not have records from this evaluation.   Preoperative labs noted. K 4.8 (hemolysis at this level may affect result).  Patient is scheduled to see cardiologist Dr. Lyman Bishop for preoperative cardiology clearance on 10/30/15. Will revisit chart once cardiology records available.  [facility]/Anesthesiology Phone [phone] 10/29/2015 9:31 AM  Addendum: Preoperative stress and echo ordered by Dr. Debara Pickett. Based on results, Dr. Debara Pickett cleared patient to proceed with back surgery.  10/31/15 Nuclear stress test:  Nuclear stress EF: 53%. The LV is mildly dilated.  There was no ST segment deviation noted during stress.  The study is normal. There is no ischemia and no evidence of infarction  This is a low risk study.  10/30/15 Echo: Study Conclusions - Left ventricle: The cavity size was normal. Wall thickness was increased in a pattern of mild LVH. Systolic function was normal. The estimated ejection fraction was in the range of 60% to 65%. Left ventricular diastolic function parameters were normal for the patient&'s age. - Aortic valve: There was mild regurgitation. - Mitral valve: There was mild regurgitation. - Right atrium: Central venous pressure (est): 3 mm Hg. - Atrial septum: No defect or patent foramen ovale was identified. - Tricuspid valve: There was trivial regurgitation. - Pulmonary arteries: PA peak pressure: 28 mm Hg (S). -  Pericardium, extracardiac: There was no pericardial effusion. Impressions: - Unable to compare directly with previous study. There is mild LVH with LVEF 60-65% and grossly normal diastolic function. Mild mitral regurgitation. Trivial to mild aortic regurgitation. Trivial tricuspid regurgitation with normal PASP 28 mmHg.  George Hugh [facility] Short Stay Center/Anesthesiology Phone [phone] 11/03/2015 10:18 AM

## 2015-10-30 ENCOUNTER — Other Ambulatory Visit: Payer: Self-pay

## 2015-10-30 ENCOUNTER — Ambulatory Visit (HOSPITAL_BASED_OUTPATIENT_CLINIC_OR_DEPARTMENT_OTHER): Payer: BLUE CROSS/BLUE SHIELD

## 2015-10-30 ENCOUNTER — Ambulatory Visit (INDEPENDENT_AMBULATORY_CARE_PROVIDER_SITE_OTHER): Payer: BLUE CROSS/BLUE SHIELD | Admitting: Internal Medicine

## 2015-10-30 ENCOUNTER — Encounter: Payer: Self-pay | Admitting: Internal Medicine

## 2015-10-30 ENCOUNTER — Encounter (HOSPITAL_COMMUNITY)
Admission: RE | Admit: 2015-10-30 | Discharge: 2015-10-30 | Disposition: A | Discharge status: Home / Self Care | Payer: BLUE CROSS/BLUE SHIELD | Source: Ambulatory Visit | Attending: Neurosurgery | Admitting: Neurosurgery

## 2015-10-30 VITALS — BP 112/80 | HR 57 | Ht 71.0 in | Wt 196.4 lb

## 2015-10-30 DIAGNOSIS — R079 Chest pain, unspecified: Secondary | ICD-10-CM | POA: Diagnosis not present

## 2015-10-30 DIAGNOSIS — Z01810 Encounter for preprocedural cardiovascular examination: Secondary | ICD-10-CM | POA: Diagnosis not present

## 2015-10-30 DIAGNOSIS — R9431 Abnormal electrocardiogram [ECG] [EKG]: Secondary | ICD-10-CM

## 2015-10-30 DIAGNOSIS — Z01818 Encounter for other preprocedural examination: Secondary | ICD-10-CM | POA: Diagnosis not present

## 2015-10-30 HISTORY — DX: Heatstroke and sunstroke, initial encounter: T67.01XA

## 2015-10-30 HISTORY — DX: Essential (primary) hypertension: I10

## 2015-10-30 HISTORY — DX: Sleep apnea, unspecified: G47.30

## 2015-10-30 NOTE — Progress Notes (Signed)
OFFICE NOTE  Chief Complaint:  Chest pain, abnormal EKG, preop risk assessment  Primary Care Physician: Tivis Ringer, MD  HPI:  Jeff Cowan is a 45 year old male is currently referred to me by Dr. Hal Neer for preoperative evaluation prior to lumbar spine surgery. Jeff Cowan has no significant history of cardiac problems. He was seen by one of my partners about 14 years ago because of an abnormal EKG which showed inferior lateral T-wave inversions that were mild. He still has a persistent EKG changes today. His last preoperative evaluation was in 2012 which showed EKG changes. The time he underwent an echocardiogram which performed at his primary care doctor's office. That showed an LVEF of 60-65%, mild LVH, mild left atrial enlargement, mild to moderate MR, mild AI, mild TR and mild PI. He's had no further workup for that. A repeat EKG in his surgeons office as well as here now shows deep inferior and lateral T-wave inversions in 23 aVF, V3 through V6. There are voltage indications of possible LVH suggesting this could be either a subendocardial ischemia or perhaps a repolarization abnormality. He does describe some infrequent chest pain which he has at night or at rest which is short-lived and sharp. Otherwise, he has been able to continue to do cycling as an exercise and can go for over an hour without any shortness of breath, chest pain or associated symptoms. He is apparently scheduled for surgery next week and is now sent for preoperative evaluation.  PMHx:  Past Medical History  Diagnosis Date  . GERD (gastroesophageal reflux disease)   . T wave inversion in EKG     "patient states that has been his norm since 2002, cardiologist seen at that tme  . Arthritis     degenerative disc  . Hypertension   . Sleep apnea   . Heat stroke     Past Surgical History  Procedure Laterality Date  . Neck surgery      2000  . Fracture surgery      left hand  . Anterior cervical  decomp/discectomy fusion  09/17/2011    Procedure: ANTERIOR CERVICAL DECOMPRESSION/DISCECTOMY FUSION 1 LEVEL;  Surgeon: Olga Coaster Kritzer;  Location: [facility];  Service: Neurosurgery;  Laterality: N/A;  Anterior Cervical Decompression and Fusion w/Stalif cage Cervical five-six (Zimmerspine)    FAMHx:  Family History  Problem Relation Age of Onset  . Heart attack Maternal Grandfather   . Cancer Paternal Grandmother     SOCHx:   reports that he has quit smoking. He does not have any smokeless tobacco history on file. He reports that he does not drink alcohol or use illicit drugs.  ALLERGIES:  Allergies  Allergen Reactions  . Bee Venom Anaphylaxis  . Penicillins Hives    Has patient had a PCN reaction causing immediate rash, facial/tongue/throat swelling, SOB or lightheadedness with hypotension: Yes Has patient had a PCN reaction causing severe rash involving mucus membranes or skin necrosis: No Has patient had a PCN reaction that required hospitalization No Has patient had a PCN reaction occurring within the last 10 years: No If all of the above answers are "NO", then may proceed with Cephalosporin use.     ROS: Pertinent items noted in HPI and remainder of comprehensive ROS otherwise negative.  HOME MEDS: Current Outpatient Prescriptions  Medication Sig Dispense Refill  . allopurinol (ZYLOPRIM) 300 MG tablet Take 300 mg by mouth daily.  6  . Biotin (BIOTIN MAXIMUM STRENGTH) 10 MG TABS Take 10,000  mg by mouth daily.    . colchicine 0.6 MG tablet Take 0.6 mg by mouth daily.  1  . finasteride (PROPECIA) 1 MG tablet Take 1 mg by mouth daily.  11  . HYDROcodone-acetaminophen (NORCO/VICODIN) 5-325 MG tablet Take 1 tablet by mouth every 6 (six) hours as needed for moderate pain.   0  . Multiple Vitamin (MULITIVITAMIN WITH MINERALS) TABS Take 1 tablet by mouth daily.      . Omega-3 Fatty Acids (FISH OIL PO) Take 1 capsule by mouth daily. 1500mg      . OVER THE COUNTER MEDICATION Take  1 capsule by mouth daily. Jeff-la 1000mg      . PROAIR HFA 108 (90 Base) MCG/ACT inhaler Take 2 puffs by mouth every 6 (six) hours as needed. pain  3  . tetrahydrozoline 0.05 % ophthalmic solution Place 2 drops into both eyes daily as needed. For dry eyes     . traMADol (ULTRAM) 50 MG tablet Take 50 mg by mouth every 6 (six) hours as needed.  3   No current facility-administered medications for this visit.    LABS/IMAGING: No results found for this or any previous visit (from the past 48 hour(s)). No results found.  WEIGHTS: Wt Readings from Last 3 Encounters:  10/30/15 196 lb 6.4 oz (89.086 kg)  10/27/15 201 lb 6.4 oz (91.354 kg)  09/17/11 200 lb (90.719 kg)    VITALS: BP 112/80 mmHg  Pulse 57  Ht 5\' 11"  (1.803 m)  Wt 196 lb 6.4 oz (89.086 kg)  BMI 27.40 kg/m2  EXAM: General appearance: alert, no distress and Appears in good condition Neck: no carotid bruit, no JVD and thyroid not enlarged, symmetric, no tenderness/mass/nodules Lungs: clear to auscultation bilaterally Heart: regular rate and rhythm, S1, S2 normal and systolic murmur: early systolic 3/6, blowing at 2nd right intercostal space Abdomen: soft, non-tender; bowel sounds normal; no masses,  no organomegaly Extremities: extremities normal, atraumatic, no cyanosis or edema Pulses: 2+ and symmetric Skin: Skin color, texture, turgor normal. No rashes or lesions Neurologic: Grossly normal Psych: Pleasant  EKG: Sinus bradycardia with sinus arrhythmia at 57, marked ST segment changes with T-wave inversions inferiorly and laterally  ASSESSMENT: 1. Abnormal EKG with atypical chest pain - findings could indicate significant ischemia or perhaps repolarization abnormality in the setting of hypertrophic cardiopathy 2. Multi-valvular heart disease 3. Indeterminate preoperative risk for back surgery  PLAN: 1.   Jeff Cowan has been having some chest discomfort which sounds very atypical. Most of the activities that one would  expect him to have chest pain or shortness of breath with he can do without limitation. His EKG is markedly abnormal but has more of an appearance of repolarization abnormality or perhaps findings that would be seen with a hypertrophic cardiomyopathy. I like to repeat an echo, especially since he had mild to moderate mitral valve disease as well as disease of the aortic, tricuspid and pulmonic valves by echo 5 years ago which is not been reassessed. He should also have a stress test as he is never had one. I recommended an exercise Myoview, specifically given the abnormal EKG which may be difficult to interpret for ischemia.  I'll contact him with those findings and if they happen to be low risk or suggest this is more likely to be a genetic hypertrophy, he may be able to go through with surgery at an acceptable risk.  Thanks for the kind referral.  Pixie Casino, MD, [facility] Attending Cardiologist [facility]  Jeff Cowan  Jeff Cowan 10/30/2015, 1:17 PM

## 2015-10-30 NOTE — Patient Instructions (Signed)
Your physician has requested that you have an echocardiogram THIS WEEK. Echocardiography is a painless test that uses sound waves to create images of your heart. It provides your doctor with information about the size and shape of your heart and how well your heart's chambers and valves are working. This procedure takes approximately one hour. There are no restrictions for this procedure.  Your physician has requested that you have an exercise stress myoview THIS WEEK. For further information please visit [email or web address]. Please follow instruction sheet, as given.  Dr Debara Pickett recommends that you schedule a follow-up appointment after the echo and stress test.

## 2015-10-31 ENCOUNTER — Ambulatory Visit (HOSPITAL_BASED_OUTPATIENT_CLINIC_OR_DEPARTMENT_OTHER): Payer: BLUE CROSS/BLUE SHIELD

## 2015-10-31 DIAGNOSIS — R9431 Abnormal electrocardiogram [ECG] [EKG]: Secondary | ICD-10-CM

## 2015-10-31 DIAGNOSIS — R079 Chest pain, unspecified: Secondary | ICD-10-CM

## 2015-10-31 DIAGNOSIS — Z01810 Encounter for preprocedural cardiovascular examination: Secondary | ICD-10-CM | POA: Diagnosis not present

## 2015-10-31 DIAGNOSIS — Z01818 Encounter for other preprocedural examination: Secondary | ICD-10-CM | POA: Diagnosis not present

## 2015-10-31 LAB — MYOCARDIAL PERFUSION IMAGING
CHL CUP NUCLEAR SDS: 1
CSEPEW: 17.2 METS
CSEPHR: 96 %
Exercise duration (min): 14 min
Exercise duration (sec): 0 s
LHR: 0.28
LV dias vol: 147 mL
LV sys vol: 69 mL
MPHR: 175 {beats}/min
Peak HR: 169 {beats}/min
Rest HR: 50 {beats}/min
SRS: 3
SSS: 4
TID: 1.02

## 2015-10-31 MED ORDER — TECHNETIUM TC 99M SESTAMIBI GENERIC - CARDIOLITE
10.3000 | Freq: Once | INTRAVENOUS | Status: AC | PRN
  Administered 2015-10-31: 10 via INTRAVENOUS

## 2015-10-31 MED ORDER — TECHNETIUM TC 99M SESTAMIBI GENERIC - CARDIOLITE
32.7000 | Freq: Once | INTRAVENOUS | Status: AC | PRN
  Administered 2015-10-31: 32.7 via INTRAVENOUS

## 2015-11-03 ENCOUNTER — Encounter: Payer: Self-pay | Admitting: Internal Medicine

## 2015-11-03 ENCOUNTER — Telehealth: Payer: Self-pay | Admitting: Internal Medicine

## 2015-11-03 NOTE — Telephone Encounter (Signed)
New message ° ° ° ° ° °Want test results from friday °

## 2015-11-03 NOTE — Telephone Encounter (Signed)
Returned call - noted pt cleared for surgery, results OK - pt voiced understanding, no further concerns.

## 2015-11-18 NOTE — Progress Notes (Signed)
I spoke with Jeff Cowan about up coming surgery scheduled for Thursday.  "Well  , it may have to be postponed, I am going to call Dr Hal Neer today. "  If surgery is not cancelled patient is aware that he needs to be here at 8:30, patient has stopped vitamins and fish oil.  Pattient has surgical scrub and handouts from PAT visit and has no questions.

## 2015-11-20 ENCOUNTER — Inpatient Hospital Stay (HOSPITAL_COMMUNITY): Admission: RE | Admit: 2015-11-20 | Payer: BLUE CROSS/BLUE SHIELD | Source: Ambulatory Visit | Admitting: Neurosurgery

## 2015-11-20 ENCOUNTER — Encounter (HOSPITAL_COMMUNITY): Admission: RE | Payer: Self-pay | Source: Ambulatory Visit

## 2015-11-20 SURGERY — POSTERIOR LUMBAR FUSION 1 LEVEL
Anesthesia: General | Site: Back

## 2016-11-27 ENCOUNTER — Telehealth: Payer: Self-pay | Admitting: Gastroenterology

## 2016-11-27 MED ORDER — HYDROCORTISONE ACE-PRAMOXINE 2.5-1 % RE CREA: 1.0000 "application " | TOPICAL_CREAM | Freq: Three times a day (TID) | RECTAL | 0 refills | Status: DC

## 2016-11-27 NOTE — Telephone Encounter (Signed)
Having bleeding, anal discomfort from hemorrhoids.  Likely from lifting, he has no issues with constipation or straining.  Will call in prescription ointment.  He's going to let me know how this is working.  I recommended colonoscopy in near future as well.

## 2016-12-01 ENCOUNTER — Telehealth: Payer: Self-pay | Admitting: Gastroenterology

## 2016-12-01 NOTE — Telephone Encounter (Signed)
Latrel is a friend of mine, having what sounds like hemorrhoidal bleeding.  Can you call him to coordinate Bruce appt this upcoming Tuesday afternoon (13th), put at end of the day.  Thanks  His cell is [phone]   Thanks

## 2016-12-02 NOTE — Telephone Encounter (Signed)
12/07/16 at 4 pm appt scheduled Left message on machine to call back

## 2016-12-02 NOTE — Telephone Encounter (Signed)
Pt has been notified of the appt date and time.

## 2016-12-07 ENCOUNTER — Encounter (INDEPENDENT_AMBULATORY_CARE_PROVIDER_SITE_OTHER): Payer: Self-pay

## 2016-12-07 ENCOUNTER — Ambulatory Visit (INDEPENDENT_AMBULATORY_CARE_PROVIDER_SITE_OTHER): Payer: BLUE CROSS/BLUE SHIELD | Admitting: Gastroenterology

## 2016-12-07 ENCOUNTER — Encounter: Payer: Self-pay | Admitting: Gastroenterology

## 2016-12-07 VITALS — BP 110/80 | HR 68 | Ht 71.0 in | Wt 187.4 lb

## 2016-12-07 DIAGNOSIS — K625 Hemorrhage of anus and rectum: Secondary | ICD-10-CM | POA: Diagnosis not present

## 2016-12-07 DIAGNOSIS — K649 Unspecified hemorrhoids: Secondary | ICD-10-CM

## 2016-12-07 MED ORDER — NA SULFATE-K SULFATE-MG SULF 17.5-3.13-1.6 GM/177ML PO SOLN: 1.0000 | Freq: Once | ORAL | 0 refills | Status: AC

## 2016-12-07 NOTE — Progress Notes (Signed)
HPI: This is a  very pleasant 46 year old man was a friend of mine.  Chief complaint is rectal bleeding, anal discomfort  He is an avid fitness buff, weight lifter. He has been lifting weights more recently with a lot of rowing, weight lifting. For the past 2 weeks or so he has had anal discomfort and rectal bleeding. About a week ago he called me it started pretty typical from for sympathetic hemorrhoids. I called him in some topical ointments may have helped. His bleeding is actually stopped and the discomfort has improved. He had a similar problem with hemorrhoidal bleeding during high school and neck she had a colonoscopy. His mom wasn't ostomy nurse at that time she was very concerned about the possibility of him having underlying significant problem and so he had a colonoscopy. He tells me this was normal.  Intentionally been losing weight with significant diet changes and increased exercise.  No constipation  Review of systems: Pertinent positive and negative review of systems were noted in the above HPI section. Complete review of systems was performed and was otherwise normal.   Past Medical History:  Diagnosis Date  . Arthritis    degenerative disc  . GERD (gastroesophageal reflux disease)   . Heat stroke   . Hypertension   . Sleep apnea   . T wave inversion in EKG    "patient states that has been his norm since 2002, cardiologist seen at that tme    Past Surgical History:  Procedure Laterality Date  . ANTERIOR CERVICAL DECOMP/DISCECTOMY FUSION  09/17/2011   Procedure: ANTERIOR CERVICAL DECOMPRESSION/DISCECTOMY FUSION 1 LEVEL;  Surgeon: Olga Coaster Kritzer;  Location: [facility];  Service: Neurosurgery;  Laterality: N/A;  Anterior Cervical Decompression and Fusion w/Stalif cage Cervical five-six (Zimmerspine)  . FRACTURE SURGERY     left hand  . NECK SURGERY     2000    Current Outpatient Prescriptions  Medication Sig Dispense Refill  . allopurinol (ZYLOPRIM) 300 MG  tablet Take 300 mg by mouth daily.  6  . Biotin (BIOTIN MAXIMUM STRENGTH) 10 MG TABS Take 10,000 mg by mouth daily.    . colchicine 0.6 MG tablet Take 0.6 mg by mouth daily.  1  . finasteride (PROPECIA) 1 MG tablet Take 1 mg by mouth daily.  11  . Multiple Vitamin (MULITIVITAMIN WITH MINERALS) TABS Take 1 tablet by mouth daily.      . Omega-3 Fatty Acids (FISH OIL PO) Take 1 capsule by mouth daily. 1500mg      . OVER THE COUNTER MEDICATION Take 1 capsule by mouth daily. c-la 1000mg      . PROAIR HFA 108 (90 Base) MCG/ACT inhaler Take 2 puffs by mouth every 6 (six) hours as needed. pain  3   No current facility-administered medications for this visit.     Allergies as of 12/07/2016 - Review Complete 12/07/2016  Allergen Reaction Noted  . Bee venom Anaphylaxis 10/30/2015  . Penicillins Hives 09/16/2011    Family History  Problem Relation Age of Onset  . Heart attack Maternal Grandfather   . Cancer Paternal Grandmother     Social History   Social History  . Marital status: Married    Spouse name: N/A  . Number of children: 1  . Years of education: N/A   Occupational History  . Not on file.   Social History Main Topics  . Smoking status: Former Research scientist (life sciences)  . Smokeless tobacco: Never Used  . Alcohol use No  . Drug use: No  .  Sexual activity: Yes   Other Topics Concern  . Not on file   Social History Narrative  . No narrative on file     Physical Exam: BP 110/80   Pulse 68   Ht 5\' 11"  (1.803 m)   Wt 187 lb 6.4 oz (85 kg)   BMI 26.14 kg/m  Constitutional: generally well-appearing Psychiatric: alert and oriented x3 Eyes: extraocular movements intact Mouth: oral pharynx moist, no lesions Neck: supple no lymphadenopathy Cardiovascular: heart regular rate and rhythm Lungs: clear to auscultation bilaterally Abdomen: soft, nontender, nondistended, no obvious ascites, no peritoneal signs, normal bowel sounds Extremities: no lower extremity edema bilaterally Skin: no  lesions on visible extremities Rectal examination: 2, small to medium sized deflated external hemorrhoids, no anal fissures, digital rectal exam revealed brown stool that was not checked for Hemoccult, there is suggestion of internal hemorrhoids during the examination as well.  Assessment and plan: 46 y.o. male with  recent intermittent rectal bleeding, external hemorrhoids, anal discomfort  He is 45 and I recommended we proceed with colonoscopy at his soonest convenience. If I note internal hemorrhoids as well as the obvious external hemorrhoids and I'm going to arrange for him to me or my partners to begin internal hemorrhoid banding protocol.     Please see the "Patient Instructions" section for addition details about the plan.   Owens Loffler, MD [facility] 12/07/2016, 3:53 PM  Cc: Prince Solian, MD

## 2016-12-07 NOTE — Patient Instructions (Addendum)
You have been scheduled for a colonoscopy. Please follow written instructions given to you at your visit today.  Please pick up your prep supplies at the pharmacy within the next 1-3 days. If you use inhalers (even only as needed), please bring them with you on the day of your procedure.   

## 2016-12-14 ENCOUNTER — Encounter: Payer: Self-pay | Admitting: Gastroenterology

## 2016-12-14 ENCOUNTER — Ambulatory Visit (AMBULATORY_SURGERY_CENTER): Payer: BLUE CROSS/BLUE SHIELD | Admitting: Gastroenterology

## 2016-12-14 VITALS — BP 100/67 | HR 44 | Temp 98.0°F | Resp 12 | Ht 71.0 in | Wt 187.0 lb

## 2016-12-14 DIAGNOSIS — K635 Polyp of colon: Secondary | ICD-10-CM

## 2016-12-14 DIAGNOSIS — K649 Unspecified hemorrhoids: Secondary | ICD-10-CM

## 2016-12-14 DIAGNOSIS — D126 Benign neoplasm of colon, unspecified: Secondary | ICD-10-CM

## 2016-12-14 DIAGNOSIS — K625 Hemorrhage of anus and rectum: Secondary | ICD-10-CM | POA: Diagnosis not present

## 2016-12-14 DIAGNOSIS — D123 Benign neoplasm of transverse colon: Secondary | ICD-10-CM

## 2016-12-14 DIAGNOSIS — D122 Benign neoplasm of ascending colon: Secondary | ICD-10-CM

## 2016-12-14 MED ORDER — SODIUM CHLORIDE 0.9 % IV SOLN: 500.0000 mL | INTRAVENOUS | Status: DC

## 2016-12-14 NOTE — Patient Instructions (Signed)
YOU HAD AN ENDOSCOPIC PROCEDURE TODAY AT THE [facility]:   Refer to the procedure report that was given to you for any specific questions about what was found during the examination.  If the procedure report does not answer your questions, please call your gastroenterologist to clarify.  If you requested that your care partner not be given the details of your procedure findings, then the procedure report has been included in a sealed envelope for you to review at your convenience later.  YOU SHOULD EXPECT: Some feelings of bloating in the abdomen. Passage of more gas than usual.  Walking can help get rid of the air that was put into your GI tract during the procedure and reduce the bloating. If you had a lower endoscopy (such as a colonoscopy or flexible sigmoidoscopy) you may notice spotting of blood in your stool or on the toilet paper. If you underwent a bowel prep for your procedure, you may not have a normal bowel movement for a few days.  Please Note:  You might notice some irritation and congestion in your nose or some drainage.  This is from the oxygen used during your procedure.  There is no need for concern and it should clear up in a day or so.  SYMPTOMS TO REPORT IMMEDIATELY:   Following lower endoscopy (colonoscopy or flexible sigmoidoscopy):  Excessive amounts of blood in the stool  Significant tenderness or worsening of abdominal pains  Swelling of the abdomen that is new, acute  Fever of 100F or higher   For urgent or emergent issues, a gastroenterologist can be reached at any hour by calling [phone]. Please read all handouts given to you by your recovery nurse.   DIET:  We do recommend a small meal at first, but then you may proceed to your regular diet.  Drink plenty of fluids but you should avoid alcoholic beverages for 24 hours.  ACTIVITY:  You should plan to take it easy for the rest of today and you should NOT DRIVE or use heavy machinery until  tomorrow (because of the sedation medicines used during the test).    FOLLOW UP: Our staff will call the number listed on your records the next business day following your procedure to check on you and address any questions or concerns that you may have regarding the information given to you following your procedure. If we do not reach you, we will leave a message.  However, if you are feeling well and you are not experiencing any problems, there is no need to return our call.  We will assume that you have returned to your regular daily activities without incident.  If any biopsies were taken you will be contacted by phone or by letter within the next 1-3 weeks.  Please call us at [phone] if you have not heard about the biopsies in 3 weeks.    SIGNATURES/CONFIDENTIALITY: You and/or your care partner have signed paperwork which will be entered into your electronic medical record.  These signatures attest to the fact that that the information above on your After Visit Summary has been reviewed and is understood.  Full responsibility of the confidentiality of this discharge information lies with you and/or your care-partner.  Thank you for letting us take care of your healthcare needs today. 

## 2016-12-14 NOTE — Progress Notes (Signed)
Report to PACU, RN, vss, BBS= Clear.  

## 2016-12-14 NOTE — Progress Notes (Signed)
Called to room to assist during endoscopic procedure.  Patient ID and intended procedure confirmed with present staff. Received instructions for my participation in the procedure from the performing physician.  

## 2016-12-14 NOTE — Op Note (Signed)
[place]  Patient Name: Jeff Cowan Procedure Date: 12/14/2016 7:52 AM MRN: [id number] Endoscopist: Milus Banister , MD Age: 46 Referring MD:  Date of Birth: 1971-03-26 Gender: Male Account #: [id number] Procedure:                Colonoscopy Indications:              Hematochezia Medicines:                Monitored Anesthesia Care Procedure:                Pre-Anesthesia Assessment:                           - Prior to the procedure, a History and Physical                            was performed, and patient medications and                            allergies were reviewed. The patient's tolerance of                            previous anesthesia was also reviewed. The risks                            and benefits of the procedure and the sedation                            options and risks were discussed with the patient.                            All questions were answered, and informed consent                            was obtained. Prior Anticoagulants: The patient has                            taken no previous anticoagulant or antiplatelet                            agents. ASA Grade Assessment: II - A patient with                            mild systemic disease. After reviewing the risks                            and benefits, the patient was deemed in                            satisfactory condition to undergo the procedure.                           After obtaining informed consent, the colonoscope  was passed under direct vision. Throughout the                            procedure, the patient's blood pressure, pulse, and                            oxygen saturations were monitored continuously. The                            Colonoscope was introduced through the anus and                            advanced to the the cecum, identified by                            appendiceal orifice and ileocecal valve. The             colonoscopy was performed without difficulty. The                            patient tolerated the procedure well. The quality                            of the bowel preparation was excellent. The                            ileocecal valve, appendiceal orifice, and rectum                            were photographed. Scope In: 8:02:01 AM Scope Out: 8:15:59 AM Scope Withdrawal Time: 0 hours 11 minutes 26 seconds  Total Procedure Duration: 0 hours 13 minutes 58 seconds  Findings:                 Two sessile polyps were found in the transverse                            colon and ascending colon. The polyps were 2 to 4                            mm in size. These polyps were removed with a cold                            snare. Resection and retrieval were complete.                           External and internal hemorrhoids were found. The                            hemorrhoids were small.                           The exam was otherwise without abnormality on  direct and retroflexion views. Complications:            No immediate complications. Estimated blood loss:                            None. Estimated Blood Loss:     Estimated blood loss: none. Impression:               - Two 2 to 4 mm polyps in the transverse colon and                            in the ascending colon, removed with a cold snare.                            Resected and retrieved.                           - External and internal hemorrhoids (small                            internal, medium sized external)                           - The examination was otherwise normal on direct                            and retroflexion views. Recommendation:           - Patient has a contact number available for                            emergencies. The signs and symptoms of potential                            delayed complications were discussed with the                             patient. Return to normal activities tomorrow.                            Written discharge instructions were provided to the                            patient.                           - Resume previous diet.                           - Continue present medications.                           - My office will arrange visit with Dr. Carlean Purl for                            hemorrhoidal banding.  You will receive a letter within 2-3 weeks with the                            pathology results and my final recommendations.                           If the polyp(s) is proven to be 'pre-cancerous' on                            pathology, you will need repeat colonoscopy in 5                            years. If the polyp(s) is NOT 'precancerous' on                            pathology then you should repeat colon cancer                            screening in 10 years with colonoscopy without need                            for colon cancer screening by any method prior to                            then (including stool testing). Milus Banister, MD 12/14/2016 8:23:33 AM This report has been signed electronically.

## 2016-12-15 ENCOUNTER — Telehealth: Payer: Self-pay

## 2016-12-15 ENCOUNTER — Telehealth: Payer: Self-pay | Admitting: *Deleted

## 2016-12-15 NOTE — Telephone Encounter (Signed)
  Follow up Call-  Call back number 12/14/2016  Post procedure Call Back phone  # [phone]  Permission to leave phone message Yes  Some recent data might be hidden     No answer, left message.

## 2016-12-15 NOTE — Telephone Encounter (Signed)
Name identifier. Left voicemail we will try to call back later today.

## 2016-12-17 ENCOUNTER — Encounter: Payer: Self-pay | Admitting: Gastroenterology

## 2017-01-04 ENCOUNTER — Encounter: Payer: Self-pay | Admitting: Internal Medicine

## 2017-01-04 ENCOUNTER — Ambulatory Visit (INDEPENDENT_AMBULATORY_CARE_PROVIDER_SITE_OTHER): Payer: BLUE CROSS/BLUE SHIELD | Admitting: Internal Medicine

## 2017-01-04 VITALS — BP 126/82 | HR 54 | Ht 71.0 in | Wt 183.0 lb

## 2017-01-04 DIAGNOSIS — K594 Anal spasm: Secondary | ICD-10-CM

## 2017-01-04 DIAGNOSIS — K641 Second degree hemorrhoids: Secondary | ICD-10-CM | POA: Diagnosis not present

## 2017-01-04 DIAGNOSIS — K644 Residual hemorrhoidal skin tags: Secondary | ICD-10-CM

## 2017-01-04 MED ORDER — DILTIAZEM GEL 2 %: 1.0000 "application " | Freq: Two times a day (BID) | CUTANEOUS | 2 refills | Status: DC

## 2017-01-04 NOTE — Patient Instructions (Addendum)
HEMORRHOID BANDING PROCEDURE    FOLLOW-UP CARE   1. The procedure you have had should have been relatively painless since the banding of the area involved does not have nerve endings and there is no pain sensation.  The rubber band cuts off the blood supply to the hemorrhoid and the band may fall off as soon as 48 hours after the banding (the band may occasionally be seen in the toilet bowl following a bowel movement). You may notice a temporary feeling of fullness in the rectum which should respond adequately to plain Tylenol or Motrin.  2. Following the banding, avoid strenuous exercise that evening and resume full activity the next day.  A sitz bath (soaking in a warm tub) or bidet is soothing, and can be useful for cleansing the area after bowel movements.     3. To avoid constipation, take two tablespoons of natural wheat bran, natural oat bran, flax, Benefiber or any over the counter fiber supplement and increase your water intake to 7-8 glasses daily.    4. Unless you have been prescribed anorectal medication, do not put anything inside your rectum for two weeks: No suppositories, enemas, fingers, etc.  5. Occasionally, you may have more bleeding than usual after the banding procedure.  This is often from the untreated hemorrhoids rather than the treated one.  Don't be concerned if there is a tablespoon or so of blood.  If there is more blood than this, lie flat with your bottom higher than your head and apply an ice pack to the area. If the bleeding does not stop within a half an hour or if you feel faint, call our office at [phone] or go to the emergency room.  6. Problems are not common; however, if there is a substantial amount of bleeding, severe pain, chills, fever or difficulty passing urine (very rare) or other problems, you should call us at [phone] or report to the nearest emergency room.  7. Do not stay seated continuously for more than 2-3 hours for a day or two  after the procedure.  Tighten your buttock muscles 10-15 times every two hours and take 10-15 deep breaths every 1-2 hours.  Do not spend more than a few minutes on the toilet if you cannot empty your bowel; instead re-visit the toilet at a later time.    We have sent the following medications to [facility] for you to pick up at your convenience: Diltiazem gel   We are giving you a handout to read and follow on anal fissures.    Please follow up with [place] for more bandings on     I appreciate the opportunity to care for you.

## 2017-01-04 NOTE — Progress Notes (Signed)
   Sxs: rectal bleeding, anal discomfort and pain and burning, intermittent fecal smearing, anal skin tag that swells and is painful. 2 bowel movements daily is typical pattern without straining. He is a weight lifter and heavy exerciser.  Rectal exam: Reveals mild irritation of the perianal area with a left lateral fleshy tag. It is small to medium. Nontender. Digital exam shows anal stenosis/spasm, mildly tender posteriorly. No palpable or obvious fissure. Formed brown stool present. Moderate length anal canal.  Anoscopy demonstrates grade 2 internal hemorrhoids in all 3 positions with mild inflammation.  PROCEDURE NOTE: The patient presents with symptomatic grade 2  hemorrhoids, requesting rubber band ligation of his/her hemorrhoidal disease.  All risks, benefits and alternative forms of therapy were described and informed consent was obtained.   The anorectum was pre-medicated with 0.125% nitroglycerin and 5% lidocaine topical The decision was made to band the left lateral internal hemorrhoid, and the [place] was used to perform band ligation without complication.  Digital anorectal examination was then performed to assure proper positioning of the band, and to adjust the banded tissue as required.  The patient was discharged home without pain or other issues.  Dietary and behavioral recommendations were given and along with follow-up instructions.     The following adjunctive treatments were recommended:  2% diltiazem gel twice a day I think this will help anal spasm symptoms he may have a subtle fissure that I cannot see but symptoms suggest that.  The patient will return in 2-3 weeks for  follow-up and possible additional banding as required. No complications were encountered and the patient tolerated the procedure well.  I appreciate the opportunity to care for this patient. CC: Tivis Ringer, MD [place].D.

## 2017-01-09 IMAGING — NM NM MISC PROCEDURE
6 series · 36 of 36 positions shown · non-contrast
Comparison: none

[Series 1: wbr_r-proj_st rest · 6.51mm/px · 6 of 64 frames shown]
[frame 6/64]
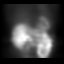
[frame 16/64]
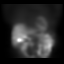
[frame 27/64]
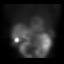
[frame 38/64]
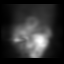
[frame 48/64]
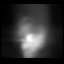
[frame 59/64]
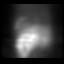

[Series 1: rest · 6.51mm/px · 6 of 64 frames shown]
[frame 6/64]
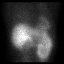
[frame 16/64]
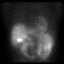
[frame 27/64]
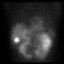
[frame 38/64]
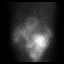
[frame 48/64]
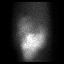
[frame 59/64]
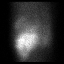

[Series 2: stress · 6.51mm/px · 6 of 501 frames shown (1 of 2)]
[frame 42/501  full-range]
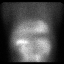
[frame 126/501  full-range]
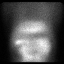
[frame 209/501  full-range]
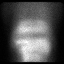
[frame 293/501  full-range]
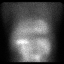
[frame 376/501  full-range]
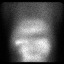
[frame 460/501  full-range]
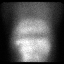

[Series 2: wbr_s-proj_st stress · 6.51mm/px · 6 of 512 frames shown (1 of 2)]
[frame 43/512]
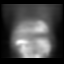
[frame 128/512]
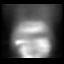
[frame 214/512]
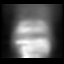
[frame 299/512]
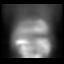
[frame 384/512]
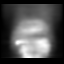
[frame 470/512]
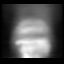

[Series 2: stress · 6.51mm/px · 6 of 64 frames shown (2 of 2)]
[frame 6/64]
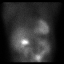
[frame 16/64]
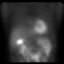
[frame 27/64]
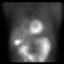
[frame 38/64]
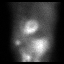
[frame 48/64]
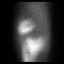
[frame 59/64]
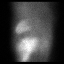

[Series 2: wbr_s-proj_st stress · 6.51mm/px · 6 of 64 frames shown (2 of 2)]
[frame 6/64]
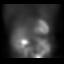
[frame 16/64]
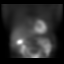
[frame 27/64]
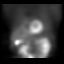
[frame 38/64]
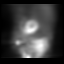
[frame 48/64]
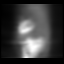
[frame 59/64]
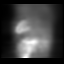

[36 of 36 positions shown; findings below may reference images not displayed]

Canned report from images found in remote index.

Refer to host system for actual result text.

## 2017-01-25 ENCOUNTER — Encounter: Payer: BLUE CROSS/BLUE SHIELD | Admitting: Internal Medicine

## 2017-02-01 ENCOUNTER — Encounter: Payer: Self-pay | Admitting: Internal Medicine

## 2017-02-01 ENCOUNTER — Ambulatory Visit (INDEPENDENT_AMBULATORY_CARE_PROVIDER_SITE_OTHER): Payer: BLUE CROSS/BLUE SHIELD | Admitting: Internal Medicine

## 2017-02-01 DIAGNOSIS — K594 Anal spasm: Secondary | ICD-10-CM | POA: Insufficient documentation

## 2017-02-01 DIAGNOSIS — K641 Second degree hemorrhoids: Secondary | ICD-10-CM | POA: Diagnosis not present

## 2017-02-01 NOTE — Assessment & Plan Note (Signed)
Improved. Continue diltiazem gel for a while, he might have had a small fissure. Uses as needed afterwards.

## 2017-02-01 NOTE — Patient Instructions (Addendum)
Follow up as needed with [place]. Continue to use Diltiazem gel for the next several days, then as needed. No biking until Thursday evening.   HEMORRHOID BANDING PROCEDURE    FOLLOW-UP CARE   1. The procedure you have had should have been relatively painless since the banding of the area involved does not have nerve endings and there is no pain sensation.  The rubber band cuts off the blood supply to the hemorrhoid and the band may fall off as soon as 48 hours after the banding (the band may occasionally be seen in the toilet bowl following a bowel movement). You may notice a temporary feeling of fullness in the rectum which should respond adequately to plain Tylenol or Motrin.  2. Following the banding, avoid strenuous exercise that evening and resume full activity the next day.  A sitz bath (soaking in a warm tub) or bidet is soothing, and can be useful for cleansing the area after bowel movements.     3. To avoid constipation, take two tablespoons of natural wheat bran, natural oat bran, flax, Benefiber or any over the counter fiber supplement and increase your water intake to 7-8 glasses daily.    4. Unless you have been prescribed anorectal medication, do not put anything inside your rectum for two weeks: No suppositories, enemas, fingers, etc.  5. Occasionally, you may have more bleeding than usual after the banding procedure.  This is often from the untreated hemorrhoids rather than the treated one.  Don't be concerned if there is a tablespoon or so of blood.  If there is more blood than this, lie flat with your bottom higher than your head and apply an ice pack to the area. If the bleeding does not stop within a half an hour or if you feel faint, call our office at [phone] or go to the emergency room.  6. Problems are not common; however, if there is a substantial amount of bleeding, severe pain, chills, fever or difficulty passing urine (very rare) or other problems, you should  call us at [phone] or report to the nearest emergency room.  7. Do not stay seated continuously for more than 2-3 hours for a day or two after the procedure.  Tighten your buttock muscles 10-15 times every two hours and take 10-15 deep breaths every 1-2 hours.  Do not spend more than a few minutes on the toilet if you cannot empty your bowel; instead re-visit the toilet at a later time.    I appreciate the opportunity to care for you. Silvano Rusk, MD, [facility]

## 2017-02-01 NOTE — Progress Notes (Signed)
  Sxs: rectal bleeding, anal discomfort and pain and burning, intermittent fecal smearing, anal skin tag that swells and is painful. 2 bowel movements daily is typical pattern without straining. He is a weight lifter and heavy exerciser  Sig improved after LL banding and using diltiazem gel (wants to stop)  PROCEDURE NOTE: The patient presents with symptomatic grade 2  hemorrhoids, requesting rubber band ligation of his/her hemorrhoidal disease.  All risks, benefits and alternative forms of therapy were described and informed consent was obtained.   The anorectum was pre-medicated with 0.125% NTG and 5% lidocaine The decision was made to band the RA and RP internal hemorrhoidw, and the [place] was used to perform band ligation without complication.  Digital anorectal examination was then performed to assure proper positioning of the band, and to adjust the banded tissue as required.  The patient was discharged home without pain or other issues.  Dietary and behavioral recommendations were given and along with follow-up instructions.     The following adjunctive treatments were recommended:  Diltiazem gel for a few more days and as needed after   The patient will return  prn for  follow-up and possible additional banding as required. No complications were encountered and the patient tolerated the procedure well.   Emmie Niemann, MD Dr. Oretha Caprice

## 2017-09-29 ENCOUNTER — Other Ambulatory Visit: Payer: Self-pay | Admitting: Internal Medicine

## 2017-09-29 DIAGNOSIS — R3121 Asymptomatic microscopic hematuria: Secondary | ICD-10-CM

## 2017-09-29 DIAGNOSIS — N23 Unspecified renal colic: Secondary | ICD-10-CM

## 2017-09-29 DIAGNOSIS — R109 Unspecified abdominal pain: Secondary | ICD-10-CM

## 2017-09-30 ENCOUNTER — Ambulatory Visit
Admission: RE | Admit: 2017-09-30 | Discharge: 2017-09-30 | Disposition: A | Discharge status: Home / Self Care | Payer: BLUE CROSS/BLUE SHIELD | Source: Ambulatory Visit | Attending: Internal Medicine | Admitting: Internal Medicine

## 2017-09-30 DIAGNOSIS — N23 Unspecified renal colic: Secondary | ICD-10-CM

## 2017-09-30 DIAGNOSIS — R3121 Asymptomatic microscopic hematuria: Secondary | ICD-10-CM

## 2017-09-30 DIAGNOSIS — R109 Unspecified abdominal pain: Secondary | ICD-10-CM

## 2017-10-28 ENCOUNTER — Other Ambulatory Visit: Payer: Self-pay

## 2019-11-12 ENCOUNTER — Other Ambulatory Visit: Payer: Self-pay

## 2019-12-05 ENCOUNTER — Other Ambulatory Visit: Payer: Self-pay | Admitting: Internal Medicine

## 2019-12-05 DIAGNOSIS — I10 Essential (primary) hypertension: Secondary | ICD-10-CM

## 2019-12-17 ENCOUNTER — Ambulatory Visit
Admission: RE | Admit: 2019-12-17 | Discharge: 2019-12-17 | Disposition: A | Discharge status: Home / Self Care | Payer: BC Managed Care – PPO | Source: Ambulatory Visit | Attending: Internal Medicine | Admitting: Internal Medicine

## 2019-12-17 DIAGNOSIS — I10 Essential (primary) hypertension: Secondary | ICD-10-CM

## 2020-08-25 ENCOUNTER — Other Ambulatory Visit: Payer: Self-pay

## 2020-08-25 DIAGNOSIS — Z20822 Contact with and (suspected) exposure to covid-19: Secondary | ICD-10-CM

## 2020-08-26 LAB — SARS-COV-2, NAA 2 DAY TAT

## 2020-08-26 LAB — NOVEL CORONAVIRUS, NAA: SARS-CoV-2, NAA: NOT DETECTED

## 2020-09-16 ENCOUNTER — Ambulatory Visit: Payer: 59 | Attending: Internal Medicine

## 2020-09-16 DIAGNOSIS — Z23 Encounter for immunization: Secondary | ICD-10-CM

## 2020-09-16 NOTE — Progress Notes (Signed)
   Covid-19 Vaccination Clinic  Name:  Jeff Cowan    MRN: [id number] DOB: 05/02/1971  09/16/2020  Mr. Tuminello was observed post Covid-19 immunization for 30 minutes based on pre-vaccination screening without incident. He was provided with Vaccine Information Sheet and instruction to access the V-Safe system.   Mr. Maske was instructed to call 911 with any severe reactions post vaccine: [name] Difficulty breathing  . Swelling of face and throat  . A fast heartbeat  . A bad rash all over body  . Dizziness and weakness   Immunizations Administered    Name Date Dose VIS Date Route   JANSSEN COVID-19 VACCINE 09/16/2020  5:04 PM 0.5 mL 07/16/2020 Intramuscular   Manufacturer: [employer]   Lot: [id number]   [place]: [phone]

## 2020-12-02 ENCOUNTER — Telehealth: Payer: Self-pay | Admitting: Gastroenterology

## 2020-12-02 NOTE — Telephone Encounter (Signed)
Pt is requesting a call back from a nurse to discuss some labs that came back and showed some blood in his stool, pt would like to know what he should do.

## 2020-12-02 NOTE — Telephone Encounter (Signed)
The pt has not been seen since 2018 in our office.  He had some labs at his PCP and was told he had some blood in his stool.  He called to make an appt for follow up.  Appt made for 01/13/21 at 830 am.  The pt has been advised of the information and verbalized understanding.   He has tried to reach out to PCP (DR [facility]VA) to get labs but states he never gets anyone on the phone and no one returns his call.

## 2021-01-13 ENCOUNTER — Encounter: Payer: Self-pay | Admitting: Gastroenterology

## 2021-01-13 ENCOUNTER — Other Ambulatory Visit: Payer: Self-pay

## 2021-01-13 ENCOUNTER — Ambulatory Visit (INDEPENDENT_AMBULATORY_CARE_PROVIDER_SITE_OTHER): Payer: 59 | Admitting: Gastroenterology

## 2021-01-13 VITALS — BP 134/80 | HR 60 | Ht 71.0 in | Wt 194.6 lb

## 2021-01-13 DIAGNOSIS — R195 Other fecal abnormalities: Secondary | ICD-10-CM

## 2021-01-13 NOTE — Patient Instructions (Addendum)
If you are age 51 or younger, your body mass index should be between 19-25. Your Body mass index is 27.14 kg/m. If this is out of the aformentioned range listed, please consider follow up with your Primary Care Provider.   You have been scheduled for a colonoscopy. Please follow written instructions given to you at your visit today.  Please pick up your prep supplies at the pharmacy within the next 1-3 days. If you use inhalers (even only as needed), please bring them with you on the day of your procedure.  Due to recent changes in healthcare laws, you may see the results of your imaging and laboratory studies on MyChart before your provider has had a chance to review them.  We understand that in some cases there may be results that are confusing or concerning to you. Not all laboratory results come back in the same time frame and the provider may be waiting for multiple results in order to interpret others.  Please give [place] 48 hours in order for your provider to thoroughly review all the results before contacting the office for clarification of your results.   Thank you for entrusting me with your care and choosing [facility].  Dr Ardis Hughs

## 2021-01-13 NOTE — Progress Notes (Signed)
Review of pertinent gastrointestinal problems: 1.  History of adenomatous colon polyps.  Colonoscopy March 2018 2 subcentimeter polyps were removed.  1 of these was a tubular adenoma.  Internal and external hemorrhoids were noted. 2.  Symptomatic hemorrhoids: Underwent in office internal hemorrhoid banding procedures with Dr. Carlean Purl in 2018   HPI: This is a very pleasant 50 year old man whom I last saw about 4 years ago the time of the colonoscopy.  See those results summarized above.  He has been doing very well lately.  No overt GI bleeding.  No significant abdominal pains.  He does have some bowel changes generally around his fasting, intermittent fasting schedule.  No unintentional weight loss.  Colon cancer does not run in his family  His primary care physician ordered I fob home stool testing as part of a routine physical and Sergei tells me that it was positive    Review of systems: Pertinent positive and negative review of systems were noted in the above HPI section. All other review negative.   Past Medical History:  Diagnosis Date  . Allergy   . Arthritis    degenerative disc  . GERD (gastroesophageal reflux disease)   . Heat stroke   . Hypertension   . Internal hemorrhoids   . Sleep apnea    does not use CPAP  . T wave inversion in EKG    "patient states that has been his norm since 2002, cardiologist seen at that tme    Past Surgical History:  Procedure Laterality Date  . ANTERIOR CERVICAL DECOMP/DISCECTOMY FUSION  09/17/2011   Procedure: ANTERIOR CERVICAL DECOMPRESSION/DISCECTOMY FUSION 1 LEVEL;  Surgeon: Olga Coaster Kritzer;  Location: [place] [facility];  Service: Neurosurgery;  Laterality: N/A;  Anterior Cervical Decompression and Fusion w/Stalif cage Cervical five-six (Zimmerspine)  . FRACTURE SURGERY     left hand  . HEMORRHOID BANDING    . KNEE SURGERY    . NECK SURGERY     2000    Current Outpatient Medications  Medication Sig Dispense Refill  . albuterol  (PROAIR HFA) 108 (90 Base) MCG/ACT inhaler as needed.    [name] allopurinol (ZYLOPRIM) 300 MG tablet Take 300 mg by mouth daily.  6  . finasteride (PROPECIA) 1 MG tablet Take 1 mg by mouth daily.  11  . amLODipine (NORVASC) 5 MG tablet Take 1 tablet by mouth in the morning.    . rosuvastatin (CRESTOR) 5 MG tablet Take 1 tablet by mouth daily.     No current facility-administered medications for this visit.    Allergies as of 01/13/2021 - Review Complete 01/13/2021  Allergen Reaction Noted  . Bee venom Anaphylaxis 10/30/2015  . Penicillins Hives 09/16/2011    Family History  Problem Relation Age of Onset  . Transient ischemic attack Mother   . Heart attack Maternal Grandfather   . Cancer Paternal Grandmother   . Colon polyps Neg Hx   . Colon cancer Neg Hx     Social History   Socioeconomic History  . Marital status: Married    Spouse name: Not on file  . Number of children: 1  . Years of education: Not on file  . Highest education level: Not on file  Occupational History  . Not on file  Tobacco Use  . Smoking status: Former Research scientist (life sciences)  . Smokeless tobacco: Never Used  Substance and Sexual Activity  . Alcohol use: No  . Drug use: No  . Sexual activity: Yes  Other Topics Concern  . Not on  file  Social History Narrative  . Not on file   Social Determinants of Health   Financial Resource Strain: Not on file  Food Insecurity: Not on file  Transportation Needs: Not on file  Physical Activity: Not on file  Stress: Not on file  Social Connections: Not on file  Intimate Partner Violence: Not on file     Physical Exam: BP 134/80   Pulse 60   Ht 5\' 11"  (1.803 m)   Wt 194 lb 9.6 oz (88.3 kg)   BMI 27.14 kg/m  Constitutional: generally well-appearing Psychiatric: alert and oriented x3 Eyes: extraocular movements intact Mouth: oral pharynx moist, no lesions Neck: supple no lymphadenopathy Cardiovascular: heart regular rate and rhythm Lungs: clear to auscultation  bilaterally Abdomen: soft, nontender, nondistended, no obvious ascites, no peritoneal signs, normal bowel sounds Extremities: no lower extremity edema bilaterally Skin: no lesions on visible extremities   Assessment and plan: 50 y.o. male with microscopic blood in his stool, being "over screened" for colon cancer  We had a very nice discussion about colon cancer screening options, polyp surveillance guidelines.  He understands that he has been over screened for colon cancer but now we have this test result to address.  I recommended colonoscopy at his soonest convenience to make sure that we are not overlooking anything significant here.  I also recommended to him that he politely declined further colon cancer screening.  Please see the "Patient Instructions" section for addition details about the plan.   Owens Loffler, MD [facility] 01/13/2021, 8:32 AM  Cc: Prince Solian, MD  Total time on date of encounter was 45  minutes (this included time spent preparing to see the patient reviewing records; obtaining and/or reviewing separately obtained history; performing a medically appropriate exam and/or evaluation; counseling and educating the patient and family if present; ordering medications, tests or procedures if applicable; and documenting clinical information in the health record).

## 2021-01-26 ENCOUNTER — Other Ambulatory Visit: Payer: Self-pay

## 2021-01-26 ENCOUNTER — Ambulatory Visit (AMBULATORY_SURGERY_CENTER): Payer: 59 | Admitting: Gastroenterology

## 2021-01-26 ENCOUNTER — Encounter: Payer: Self-pay | Admitting: Gastroenterology

## 2021-01-26 VITALS — BP 103/52 | HR 50 | Temp 98.3°F | Resp 10 | Ht 71.0 in | Wt 194.0 lb

## 2021-01-26 DIAGNOSIS — D120 Benign neoplasm of cecum: Secondary | ICD-10-CM

## 2021-01-26 DIAGNOSIS — D123 Benign neoplasm of transverse colon: Secondary | ICD-10-CM

## 2021-01-26 DIAGNOSIS — R195 Other fecal abnormalities: Secondary | ICD-10-CM

## 2021-01-26 DIAGNOSIS — D125 Benign neoplasm of sigmoid colon: Secondary | ICD-10-CM

## 2021-01-26 DIAGNOSIS — K648 Other hemorrhoids: Secondary | ICD-10-CM

## 2021-01-26 MED ORDER — SODIUM CHLORIDE 0.9 % IV SOLN: 500.0000 mL | Freq: Once | INTRAVENOUS | Status: DC

## 2021-01-26 NOTE — Progress Notes (Signed)
Called to room to assist during endoscopic procedure.  Patient ID and intended procedure confirmed with present staff. Received instructions for my participation in the procedure from the performing physician.  

## 2021-01-26 NOTE — Progress Notes (Signed)
Report to PACU, RN, vss, BBS= Clear.  

## 2021-01-26 NOTE — Progress Notes (Signed)
Pt's states no medical or surgical changes since previsit or office visit. 

## 2021-01-26 NOTE — Op Note (Signed)
[place] Patient Name: Jeff Cowan Procedure Date: 01/26/2021 10:20 AM MRN: [id number] Endoscopist: Milus Banister , MD Age: 50 Referring MD:  Date of Birth: 1971/08/25 Gender: Male Account #: [id number] Procedure:                Colonoscopy Indications:              Heme positive stool; Colonoscopy 2018 two subCM                            adenomas removed Medicines:                Monitored Anesthesia Care Procedure:                Pre-Anesthesia Assessment:                           - Prior to the procedure, a History and Physical                            was performed, and patient medications and                            allergies were reviewed. The patient's tolerance of                            previous anesthesia was also reviewed. The risks                            and benefits of the procedure and the sedation                            options and risks were discussed with the patient.                            All questions were answered, and informed consent                            was obtained. Prior Anticoagulants: The patient has                            taken no previous anticoagulant or antiplatelet                            agents. ASA Grade Assessment: I - A normal, healthy                            patient. After reviewing the risks and benefits,                            the patient was deemed in satisfactory condition to                            undergo the procedure.  After obtaining informed consent, the colonoscope                            was passed under direct vision. Throughout the                            procedure, the patient's blood pressure, pulse, and                            oxygen saturations were monitored continuously. The                            Olympus CF-HQ190 [id number]) Colonoscope was                            introduced through the anus and advanced to the the                             cecum, identified by appendiceal orifice and                            ileocecal valve. The colonoscopy was performed                            without difficulty. The patient tolerated the                            procedure well. The quality of the bowel                            preparation was good. The ileocecal valve,                            appendiceal orifice, and rectum were photographed. Scope In: 10:26:46 AM Scope Out: 10:39:42 AM Scope Withdrawal Time: 0 hours 8 minutes 50 seconds  Total Procedure Duration: 0 hours 12 minutes 56 seconds  Findings:                 Three sessile polyps were found in the sigmoid                            colon, transverse colon and cecum. The polyps were                            2 to 3 mm in size. These polyps were removed with a                            cold snare. Resection and retrieval were complete.                           External hemorrhoids were found. The hemorrhoids                            were small.  The exam was otherwise without abnormality on                            direct and retroflexion views. Complications:            No immediate complications. Estimated blood loss:                            None. Estimated Blood Loss:     Estimated blood loss: none. Impression:               - Three 2 to 3 mm polyps in the sigmoid colon, in                            the transverse colon and in the cecum, removed with                            a cold snare. Resected and retrieved.                           - External hemorrhoids.                           - The examination was otherwise normal on direct                            and retroflexion views. Recommendation:           - Patient has a contact number available for                            emergencies. The signs and symptoms of potential                            delayed complications were discussed with the                             patient. Return to normal activities tomorrow.                            Written discharge instructions were provided to the                            patient.                           - Resume previous diet.                           - Continue present medications.                           - Await pathology results. Milus Banister, MD 01/26/2021 10:45:11 AM This report has been signed electronically.

## 2021-01-26 NOTE — Patient Instructions (Signed)
HANDOUTS PROVIDED ON: POLYPS & HEMORRHOIDS  The polyps removed today have been sent for pathology.  The results can take 1-3 weeks to receive.  When your next colonoscopy should occur will be based on the pathology results.    You may resume your previous diet and medication schedule.  Thank you for allowing [place] to care for you today!!!   YOU HAD AN ENDOSCOPIC PROCEDURE TODAY AT [facility]:   Refer to the procedure report that was given to you for any specific questions about what was found during the examination.  If the procedure report does not answer your questions, please call your gastroenterologist to clarify.  If you requested that your care partner not be given the details of your procedure findings, then the procedure report has been included in a sealed envelope for you to review at your convenience later.  YOU SHOULD EXPECT: Some feelings of bloating in the abdomen. Passage of more gas than usual.  Walking can help get rid of the air that was put into your GI tract during the procedure and reduce the bloating. If you had a lower endoscopy (such as a colonoscopy or flexible sigmoidoscopy) you may notice spotting of blood in your stool or on the toilet paper. If you underwent a bowel prep for your procedure, you may not have a normal bowel movement for a few days.  Please Note:  You might notice some irritation and congestion in your nose or some drainage.  This is from the oxygen used during your procedure.  There is no need for concern and it should clear up in a day or so.  SYMPTOMS TO REPORT IMMEDIATELY:   Following lower endoscopy (colonoscopy or flexible sigmoidoscopy):  Excessive amounts of blood in the stool  Significant tenderness or worsening of abdominal pains  Swelling of the abdomen that is new, acute  Fever of 100F or higher  For urgent or emergent issues, a gastroenterologist can be reached at any hour by calling [phone]. Do not use MyChart  messaging for urgent concerns.    DIET:  We do recommend a small meal at first, but then you may proceed to your regular diet.  Drink plenty of fluids but you should avoid alcoholic beverages for 24 hours.  ACTIVITY:  You should plan to take it easy for the rest of today and you should NOT DRIVE or use heavy machinery until tomorrow (because of the sedation medicines used during the test).    FOLLOW UP: Our staff will call the number listed on your records Wednesday morning between 7:15 am and 8:15 am following your procedure to check on you and address any questions or concerns that you may have regarding the information given to you following your procedure. If we do not reach you, we will leave a message.  We will attempt to reach you two times.  During this call, we will ask if you have developed any symptoms of COVID 19. If you develop any symptoms (ie: fever, flu-like symptoms, shortness of breath, cough etc.) before then, please call [phone].  If you test positive for Covid 19 in the 2 weeks post procedure, please call and report this information to [place].    If any biopsies were taken you will be contacted by phone or by letter within the next 1-3 weeks.  Please call us at [phone] if you have not heard about the biopsies in 3 weeks.    SIGNATURES/CONFIDENTIALITY: You and/or your care partner  have signed paperwork which will be entered into your electronic medical record.  These signatures attest to the fact that that the information above on your After Visit Summary has been reviewed and is understood.  Full responsibility of the confidentiality of this discharge information lies with you and/or your care-partner.

## 2021-01-28 ENCOUNTER — Telehealth: Payer: Self-pay

## 2021-01-28 NOTE — Telephone Encounter (Signed)
  Follow up Call-  Call back number 01/26/2021  Post procedure Call Back phone  # [phone]  Permission to leave phone message Yes  Some recent data might be hidden     Patient questions:  Do you have a fever, pain , or abdominal swelling? No. Pain Score  0 *  Have you tolerated food without any problems? Yes.    Have you been able to return to your normal activities? Yes.    Do you have any questions about your discharge instructions: Diet   No. Medications  No. Follow up visit  No.  Do you have questions or concerns about your Care? No.  Actions: * If pain score is 4 or above: No action needed, pain <4. 1. Have you developed a fever since your procedure? no  2.   Have you had an respiratory symptoms (SOB or cough) since your procedure? no  3.   Have you tested positive for COVID 19 since your procedure no  4.   Have you had any family members/close contacts diagnosed with the COVID 19 since your procedure?  no   If yes to any of these questions please route to Joylene John, RN and Joella Prince, RN

## 2022-12-06 ENCOUNTER — Ambulatory Visit: Payer: 59 | Admitting: Gastroenterology

## 2022-12-06 ENCOUNTER — Encounter: Payer: Self-pay | Admitting: Gastroenterology

## 2022-12-06 VITALS — BP 130/80 | HR 50 | Ht 71.0 in | Wt 188.0 lb

## 2022-12-06 DIAGNOSIS — K644 Residual hemorrhoidal skin tags: Secondary | ICD-10-CM | POA: Diagnosis not present

## 2022-12-06 DIAGNOSIS — Z86010 Personal history of colonic polyps: Secondary | ICD-10-CM | POA: Diagnosis not present

## 2022-12-06 NOTE — Patient Instructions (Addendum)
Please purchase the following medications over the counter and take as directed:  Recticare: as needed  You are going to be scheduled for an appointment with Dr Dema Severin at [facility]. Make certain to bring a list of current medications, including any over the counter medications or vitamins. [facility] is located at [address]. Please contact them at [phone] if you dont hear from them in 3 weeks.  _______________________________________________________  If your blood pressure at your visit was 140/90 or greater, please contact your primary care physician to follow up on this.  _______________________________________________________  If you are age 34 or older, your body mass index should be between 23-30. Your Body mass index is 26.22 kg/m. If this is out of the aforementioned range listed, please consider follow up with your Primary Care Provider.  If you are age 109 or younger, your body mass index should be between 19-25. Your Body mass index is 26.22 kg/m. If this is out of the aformentioned range listed, please consider follow up with your Primary Care Provider.   ________________________________________________________  The [facility] providers would like to encourage you to use [facility] to communicate with providers for non-urgent requests or questions.  Due to long hold times on the telephone, sending your provider a message by [facility] may be a faster and more efficient way to get a response.  Please allow 48 business hours for a response.  Please remember that this is for non-urgent requests.  _______________________________________________________ It was a pleasure to see you today!  Thank you for trusting me with your gastrointestinal care!

## 2022-12-06 NOTE — Progress Notes (Signed)
Chief Complaint:    Symptomatic hemorrhoids  GI History:  - 11/2016: Colonoscopy: 2 subcentimeter polyps, one was a tubular adenoma.  Internal/external hemorrhoids - Underwent hemorrhoid banding by Dr. Carlean Purl in 2018 - 01/2021: Colonoscopy: 3 subcentimeter polyps (path: Tubular adenomas), Small external hemorrhoids.  Repeat in 3 years  HPI:     Patient is a 52 y.o. male presenting to the Gastroenterology Clinic for evaluation of symptomatic hemorrhoids.  Previously followed with Dr. Ardis Hughs, last seen 01/13/2021.  Did have heme positive stool and recommended repeat screening colonoscopy.  He has otherwise been asymptomatic from a hemorrhoid standpoint after undergoing hemorrhoid banding series by Dr. Carlean Purl in 2018.   Recurrence of hemorrhoidal symptoms over the last 1.5-2 weeks.  Described as painful.  Pain seems to be improving today.  Did have scant BRB on tissue paper during this episode, o/w he had been asymptomatic since hemorrhoid banding in 2018.   He does drive long distances and for extended periods of time for his work.  Had been sitting on a doughnut in the past more from a comfort standpoint rather than hemorrhoidal symptoms.  No recent labs or abdominal imaging for review.  Review of systems:     No chest pain, no SOB, no fevers, no urinary sx   Past Medical History:  Diagnosis Date   Allergy    Arthritis    degenerative disc   GERD (gastroesophageal reflux disease)    Heat stroke    Hypertension    Internal hemorrhoids    Sleep apnea    does not use CPAP   T wave inversion in EKG    "patient states that has been his norm since 2002, cardiologist seen at that tme    Patient's surgical history, family medical history, social history, medications and allergies were all reviewed in Epic    Current Outpatient Medications  Medication Sig Dispense Refill   albuterol (PROAIR HFA) 108 (90 Base) MCG/ACT inhaler as needed.     allopurinol (ZYLOPRIM) 300 MG tablet Take  300 mg by mouth daily.  6   amLODipine (NORVASC) 5 MG tablet Take 1 tablet by mouth in the morning.     finasteride (PROPECIA) 1 MG tablet Take 1 mg by mouth daily.  11   rosuvastatin (CRESTOR) 5 MG tablet Take 1 tablet by mouth daily.     No current facility-administered medications for this visit.    Physical Exam:     BP 130/80   Pulse (!) 50   Ht '5\' 11"'$  (1.803 m)   Wt 188 lb (85.3 kg)   SpO2 97%   BMI 26.22 kg/m   GENERAL:  Pleasant male in NAD PSYCH: : Cooperative, normal affect Musculoskeletal:  Normal muscle tone, normal strength NEURO: Alert and oriented x 3, no focal neurologic deficits Rectal exam: Sensation intact and preserved anal wink.  External skin tag on left, external skin tag with partially thrombosed external hemorrhoid on the right.  No TTP.  No external anal fissures. Normal sphincter tone. No palpable mass. No blood on the exam glove. (Chaperone: Esaw Grandchild, CMA).   IMPRESSION and PLAN:    1) External hemorrhoid Symptomatic external hemorrhoid that was likely thrombosed, and now seems to be resorbing.  Pain improving and no TTP on exam today.  Do not feel that immediate surgical referral for I&D today is needed.  Did discuss role/utility of further surgical evaluation.  Given his history and occupation, he would like an evaluation in the Colorectal Surgery clinic. - Referral  to Colorectal Surgery - Can use RectiCare OTC prn  2) Internal hemorrhoids Has otherwise had a robust response to previous hemorrhoid banding.  Additional banding was not indicated today based on exam and clinical presentation.  3) History of colon polyps - Repeat colonoscopy in 01/2024 for ongoing polyp surveillance  I spent over 30 minutes of time, including in depth chart review, independent review of results as outlined above, communicating results with the patient directly, face-to-face time with the patient, coordinating care, and ordering studies and medications as appropriate, and  documentation.       [place] ,DO, FACG 12/06/2022, 3:20 PM

## 2023-01-07 ENCOUNTER — Other Ambulatory Visit: Payer: Self-pay | Admitting: Internal Medicine

## 2023-01-07 DIAGNOSIS — E785 Hyperlipidemia, unspecified: Secondary | ICD-10-CM

## 2023-02-11 ENCOUNTER — Ambulatory Visit
Admission: RE | Admit: 2023-02-11 | Discharge: 2023-02-11 | Disposition: A | Discharge status: Home / Self Care | Payer: No Typology Code available for payment source | Source: Ambulatory Visit | Attending: Internal Medicine | Admitting: Internal Medicine

## 2023-02-11 DIAGNOSIS — E785 Hyperlipidemia, unspecified: Secondary | ICD-10-CM

## 2023-04-22 ENCOUNTER — Encounter: Payer: Self-pay | Admitting: Cardiovascular Disease

## 2023-04-22 ENCOUNTER — Ambulatory Visit: Payer: 59 | Attending: Cardiovascular Disease | Admitting: Cardiovascular Disease

## 2023-04-22 ENCOUNTER — Telehealth: Payer: Self-pay | Admitting: Cardiovascular Disease

## 2023-04-22 VITALS — BP 122/74 | HR 55 | Ht 71.0 in | Wt 181.0 lb

## 2023-04-22 DIAGNOSIS — I10 Essential (primary) hypertension: Secondary | ICD-10-CM

## 2023-04-22 DIAGNOSIS — I517 Cardiomegaly: Secondary | ICD-10-CM

## 2023-04-22 DIAGNOSIS — R931 Abnormal findings on diagnostic imaging of heart and coronary circulation: Secondary | ICD-10-CM

## 2023-04-22 DIAGNOSIS — R002 Palpitations: Secondary | ICD-10-CM | POA: Diagnosis not present

## 2023-04-22 DIAGNOSIS — T466X5A Adverse effect of antihyperlipidemic and antiarteriosclerotic drugs, initial encounter: Secondary | ICD-10-CM

## 2023-04-22 DIAGNOSIS — G720 Drug-induced myopathy: Secondary | ICD-10-CM

## 2023-04-22 DIAGNOSIS — E7800 Pure hypercholesterolemia, unspecified: Secondary | ICD-10-CM

## 2023-04-22 MED ORDER — EZETIMIBE 10 MG PO TABS: 10.0000 mg | ORAL_TABLET | Freq: Every day | ORAL | 3 refills | Status: AC

## 2023-04-22 NOTE — Patient Instructions (Addendum)
Medication Instructions:  Zetia 10 mg daily *If you need a refill on your cardiac medications before your next appointment, please call your pharmacy*   Lab Work: Lipid panel- Please return for Blood Work in 3 months. No appointment needed, lab here at the office is open Monday-Friday from 8AM to 4PM and closed daily for lunch from 12:45-1:45.   If you have labs (blood work) drawn today and your tests are completely normal, you will receive your results only by: MyChart Message (if you have MyChart) OR A paper copy in the mail If you have any lab test that is abnormal or we need to change your treatment, we will call you to review the results.   Testing/Procedures: Your physician has requested that you have an echocardiogram. Echocardiography is a painless test that uses sound waves to create images of your heart. It provides your doctor with information about the size and shape of your heart and how well your heart's chambers and valves are working. This procedure takes approximately one hour. There are no restrictions for this procedure. Please do NOT wear cologne, perfume, aftershave, or lotions (deodorant is allowed). Please arrive 15 minutes prior to your appointment time.    Follow-Up: At [facility], you and your health needs are our priority.  As part of our continuing mission to provide you with exceptional heart care, we have created designated Provider Care Teams.  These Care Teams include your primary Cardiologist (physician) and Advanced Practice Providers (APPs -  Physician Assistants and Nurse Practitioners) who all work together to provide you with the care you need, when you need it.  We recommend signing up for the patient portal called "MyChart".  Sign up information is provided on this After Visit Summary.  MyChart is used to connect with patients for Virtual Visits (Telemedicine).  Patients are able to view lab/test results, encounter notes, upcoming appointments,  etc.  Non-urgent messages can be sent to your provider as well.   To learn more about what you can do with MyChart, go to [email or web address].    Your next appointment:    Follow up as needed  Provider:   Dr Royann Shivers  Needs Smart Watch

## 2023-04-22 NOTE — Telephone Encounter (Signed)
Patient states that he need the nurse to give him a call back. He forgot to mention he had numbness in the right arm, that led him to have the scan done. That happen twice in 10months. Please advise

## 2023-04-22 NOTE — Progress Notes (Signed)
Cardiology Office Note:  .   Date:  04/22/2023  ID:  Jeff Cowan, DOB December 06, 1970, MRN [id number] PCP: Chilton Greathouse, MD  [facility] Providers Cardiologist:  None    History of Present Illness: .   Jeff Cowan is a 52 y.o. male who is being seen today for the evaluation of elevated coronary calcium score at the request of Avva, Ravisankar, MD. additional cardiac problems include palpitations and abnormal ECG.  [name] is very physically active.  He exercises a minimum of an hour a day doing both strength and aerobic exercise.  His resting heart rate is 55 bpm, his BMI is 25.Jeff Cowan  He performs high reps, low load type exercises.  Denies any chest pain or shortness of breath with activity, although he sometimes has chest pressure when lying on his side in bed.  This is relieved by changing position.  He has random palpitations.  They are sometimes present during physical activity, but not necessarily.  More frequently they occur at rest.  In the past he was diagnosed with sleep apnea.  He subsequently lost 40 pounds of weight and according to his family he is snoring a lot less.  He does not have daytime hypersomnolence.  He is not using CPAP.  He had hypertension that was not always treated.  For a long time his systolic blood pressure was in the 140s and he resisted taking medications.  He never had severe hypertension.  His blood pressure has been easily controlled with single agents.  Recently he stopped taking amlodipine since he felt this was contributing to his problems with tinnitus.  He is now only taking metoprolol succinate 50 mg daily and this is doing a good job of controlling his blood pressure.  It has not had any positive impact on his palpitations.  He recently underwent a coronary calcium score (230, 93rd percentile, virtually all plaque seen in the LAD artery).  There was also evidence of atherosclerotic calcifications in the thoracic aorta.  His LDL cholesterol is only  mildly outside desirable range at 105.  HDL is excellent at 54.  He has normal triglycerides and normal renal function.  He does not have diabetes mellitus.  There is not a strong family history of coronary disease.  The only family member who died of heart attack was his maternal grandfather and this happened when he was in his mid to late 69s.  Both his parents are alive in their late 64s.  His mother does have a history of a stroke.  He had a low risk nuclear study in 2017 and normal echocardiogram.  He stated that from the beginning that he is opposed to taking statins.  When he took these in the past he felt that they caused joint aches.  He also reports that his mother had serious skin side effects from.  I pointed out that we have newer effective medicines that are injectable, but he also immediately said he was opposed to any shots.   ROS: 10 at this intermittently, no other neurological complaints  Studies Reviewed: Jeff Cowan   EKG Interpretation Date/Time:  Friday April 22 2023 10:05:11 EDT Ventricular Rate:  55 PR Interval:  230 QRS Duration:  88 QT Interval:  434 QTC Calculation: 415 R Axis:   62  Text Interpretation: Sinus bradycardia with 1st degree A-V block ST & T wave abnormality, consider inferolateral ischemia When compared with ECG of 27-Oct-2015 09:34, No significant change was found Confirmed by Evalisse Prajapati (  [id number]) on 04/22/2023 10:16:43 AM     Labs from PCP   02/17/2023 Glucose 94, creatinine 1.1, potassium 4.3, normal liver function tests, normal TSH  12/01/2021 Cholesterol 169, HDL 54, LDL 70, triglycerides 51  Calcium score 02/12/2023 CORONARY CALCIUM SCORES:   Left Main: 0   LAD: 230   LCx: 0   RCA: 0   Total Agatston Score: 230   MESA database percentile: 93rd   AORTA MEASUREMENTS:   Ascending Aorta: 3.7 cm   Descending Aorta:2.3 cm   Echocardiogram 2017: - Left ventricle: The cavity size was normal. Wall thickness was    increased in a  pattern of mild LVH. Systolic function was normal.    The estimated ejection fraction was in the range of 60% to 65%.    Left ventricular diastolic function parameters were normal for    the patient&'s age.  - Aortic valve: There was mild regurgitation.  - Mitral valve: There was mild regurgitation.  - Right atrium: Central venous pressure (est): 3 mm Hg.  - Atrial septum: No defect or patent foramen ovale was identified.  - Tricuspid valve: There was trivial regurgitation.  - Pulmonary arteries: PA peak pressure: 28 mm Hg (S).  - Pericardium, extracardiac: There was no pericardial effusion.   Nuclear stress test 2017:  Nuclear stress EF: 53%. The LV is mildly dilated. There was no ST segment deviation noted during stress. The study is normal. There is no ischemia and no evidence of infarction This is a low risk study.   Nuclear stress EF: 53%. The LV is mildly dilated. There was no ST segment deviation noted during stress. The study is normal. There is no ischemia and no evidence of infarction This is a low risk study.  Renal duplex ultrasound 2021: Directed duplex of the renal artery demonstrates no significant stenosis     Risk Assessment/Calculations:             Physical Exam:   VS:  BP 122/74 (BP Location: Left Arm, Patient Position: Sitting, Cuff Size: Large)   Pulse (!) 55   Ht 5\' 11"  (1.803 m)   Wt 181 lb (82.1 kg)   SpO2 95%   BMI 25.24 kg/m    Wt Readings from Last 3 Encounters:  04/22/23 181 lb (82.1 kg)  12/06/22 188 lb (85.3 kg)  01/26/21 194 lb (88 kg)    GEN: Well nourished, well developed in no acute distress.  Looks very fit, even athletic NECK: No JVD; No carotid bruits CARDIAC: RRR, no murmurs, rubs, gallops RESPIRATORY:  Clear to auscultation without rales, wheezing or rhonchi  ABDOMEN: Soft, non-tender, non-distended EXTREMITIES:  No edema; No deformity   ASSESSMENT AND PLAN: .    Elevated coronary calcium score: This greatly increases his  estimated cardiovascular risk (essentially it is double when his calcium score is added to conventional risk factors).  He is already living a healthy lifestyle with lots of exercise and a relatively healthy diet (does have a weakness for cookies).  I do not think he could bring his cholesterol down further without pharmacological intervention.  Other risk factors are all well addressed. HLP: Ideally would like LDL less than 70.  He is strongly biased against statins due to his own personal past experience of side effects.  He also immediately opposed the idea of injectable medications.  That only leaves the option for Zetia and Nexletol.  I doubt we will get coverage for Nexletol simply based on his coronary calcium score.  I told him I am not optimistic that area strong enough to get [place] to target.  Nevertheless, he is agreeable to take it and we will check another lipid profile in 3 months. Palpitations: These most likely represent PACs or PVCs, he is not experiencing them currently.  Since the symptoms are unpredictable and not particularly frequent, we could easily miss them on an event monitor.  I told him the best approach would be to have him purchase a personal electronic device such as a Kardia or smart watch and send me examples of symptomatic rhythm strips via MyChart. LVH: The EKG changes of LVH are quite striking, considering the fact that his blood pressure has never been severely elevated and has been easy to control.  An LVH pattern has been present as far back as 2012.  It is possible that at a younger age he had a period of time when his blood pressure was higher.  Mild LVH was reported on his echo from 2017, I would like to repeat his echocardiogram. HTN: Excellent control on metoprolol monotherapy.  No evidence of renal artery stenosis by duplex ultrasonography in 2021 History of OSA: Diagnosed when he weighed about 40 pounds more.  Currently denies daytime hypersomnolence and reportedly is  not snoring as much.  Not using CPAP.       Dispo:  Patient Instructions  Medication Instructions:  Zetia 10 mg daily *If you need a refill on your cardiac medications before your next appointment, please call your pharmacy*   Lab Work: Lipid panel- Please return for Blood Work in 3 months. No appointment needed, lab here at the office is open Monday-Friday from 8AM to 4PM and closed daily for lunch from 12:45-1:45.   If you have labs (blood work) drawn today and your tests are completely normal, you will receive your results only by: MyChart Message (if you have MyChart) OR A paper copy in the mail If you have any lab test that is abnormal or we need to change your treatment, we will call you to review the results.   Testing/Procedures: Your physician has requested that you have an echocardiogram. Echocardiography is a painless test that uses sound waves to create images of your heart. It provides your doctor with information about the size and shape of your heart and how well your heart's chambers and valves are working. This procedure takes approximately one hour. There are no restrictions for this procedure. Please do NOT wear cologne, perfume, aftershave, or lotions (deodorant is allowed). Please arrive 15 minutes prior to your appointment time.    Follow-Up: At [facility], you and your health needs are our priority.  As part of our continuing mission to provide you with exceptional heart care, we have created designated Provider Care Teams.  These Care Teams include your primary Cardiologist (physician) and Advanced Practice Providers (APPs -  Physician Assistants and Nurse Practitioners) who all work together to provide you with the care you need, when you need it.  We recommend signing up for the patient portal called "MyChart".  Sign up information is provided on this After Visit Summary.  MyChart is used to connect with patients for Virtual Visits (Telemedicine).   Patients are able to view lab/test results, encounter notes, upcoming appointments, etc.  Non-urgent messages can be sent to your provider as well.   To learn more about what you can do with MyChart, go to [email or web address].    Your next appointment:    Follow up as  needed  Provider:   Dr Royann Shivers  Needs Smart Watch      Signed, Thurmon Fair, MD

## 2023-04-22 NOTE — Telephone Encounter (Signed)
Strongly suspect that that had nothing to do with his heart.  Most likely to be musculoskeletal.

## 2023-04-25 ENCOUNTER — Ambulatory Visit (HOSPITAL_COMMUNITY): Payer: 59 | Attending: Cardiovascular Disease

## 2023-04-25 DIAGNOSIS — I083 Combined rheumatic disorders of mitral, aortic and tricuspid valves: Secondary | ICD-10-CM | POA: Diagnosis not present

## 2023-04-25 DIAGNOSIS — R002 Palpitations: Secondary | ICD-10-CM | POA: Diagnosis present

## 2023-04-25 LAB — ECHOCARDIOGRAM COMPLETE
Area-P 1/2: 3.01 cm2
P 1/2 time: 953 msec
S' Lateral: 2.7 cm

## 2023-04-25 NOTE — Telephone Encounter (Signed)
Called Jeff Cowan back and relayed Dr. Renaye Rakers response that it is not heart related but most likely musculoskeletal. Advised Jeff Cowan to contact is family doctor. Jeff Cowan. Verbalized understanding.
